# Patient Record
Sex: Female | Born: 1975 | Race: White | Hispanic: No | Marital: Single | Smoking: Former smoker
Health system: Southern US, Community
[De-identification: ages and names within clinical notes are randomized; demographics above are authoritative.]

## PROBLEM LIST (undated history)

## (undated) DIAGNOSIS — Z8614 Personal history of Methicillin resistant Staphylococcus aureus infection: Secondary | ICD-10-CM

## (undated) DIAGNOSIS — R51 Headache: Secondary | ICD-10-CM

## (undated) HISTORY — PX: DILATION AND CURETTAGE OF UTERUS: SHX78

## (undated) HISTORY — PX: HERNIA REPAIR: SHX51

---

## 2003-09-16 DIAGNOSIS — Z8614 Personal history of Methicillin resistant Staphylococcus aureus infection: Secondary | ICD-10-CM

## 2003-09-16 HISTORY — DX: Personal history of Methicillin resistant Staphylococcus aureus infection: Z86.14

## 2006-09-18 ENCOUNTER — Emergency Department (HOSPITAL_COMMUNITY): Admission: EM | Admit: 2006-09-18 | Discharge: 2006-09-18 | Payer: Self-pay | Admitting: Emergency Medicine

## 2011-01-10 LAB — HIV ANTIBODY (ROUTINE TESTING W REFLEX): HIV: NONREACTIVE

## 2011-01-10 LAB — ABO/RH: RH Type: POSITIVE

## 2011-01-10 LAB — HEPATITIS B SURFACE ANTIGEN: Hepatitis B Surface Ag: NEGATIVE

## 2011-03-03 ENCOUNTER — Other Ambulatory Visit (HOSPITAL_COMMUNITY): Payer: Self-pay | Admitting: Obstetrics and Gynecology

## 2011-03-03 DIAGNOSIS — Z3689 Encounter for other specified antenatal screening: Secondary | ICD-10-CM

## 2011-03-06 ENCOUNTER — Ambulatory Visit (HOSPITAL_COMMUNITY)
Admission: RE | Admit: 2011-03-06 | Discharge: 2011-03-06 | Disposition: A | Discharge status: Home / Self Care | Payer: Medicaid Other | Source: Ambulatory Visit | Attending: Obstetrics and Gynecology | Admitting: Obstetrics and Gynecology

## 2011-03-06 DIAGNOSIS — Z363 Encounter for antenatal screening for malformations: Secondary | ICD-10-CM | POA: Insufficient documentation

## 2011-03-06 DIAGNOSIS — Z3689 Encounter for other specified antenatal screening: Secondary | ICD-10-CM

## 2011-03-06 DIAGNOSIS — O358XX0 Maternal care for other (suspected) fetal abnormality and damage, not applicable or unspecified: Secondary | ICD-10-CM | POA: Insufficient documentation

## 2011-03-06 DIAGNOSIS — O09299 Supervision of pregnancy with other poor reproductive or obstetric history, unspecified trimester: Secondary | ICD-10-CM | POA: Insufficient documentation

## 2011-03-06 DIAGNOSIS — Z1389 Encounter for screening for other disorder: Secondary | ICD-10-CM | POA: Insufficient documentation

## 2011-03-06 DIAGNOSIS — O2620 Pregnancy care for patient with recurrent pregnancy loss, unspecified trimester: Secondary | ICD-10-CM | POA: Insufficient documentation

## 2011-03-11 ENCOUNTER — Other Ambulatory Visit (HOSPITAL_COMMUNITY): Payer: Self-pay | Admitting: Obstetrics & Gynecology

## 2011-03-11 DIAGNOSIS — Z3689 Encounter for other specified antenatal screening: Secondary | ICD-10-CM

## 2011-03-28 ENCOUNTER — Ambulatory Visit (HOSPITAL_COMMUNITY)
Admission: RE | Admit: 2011-03-28 | Discharge: 2011-03-28 | Disposition: A | Discharge status: Home / Self Care | Payer: Medicaid Other | Source: Ambulatory Visit | Attending: Obstetrics & Gynecology | Admitting: Obstetrics & Gynecology

## 2011-03-28 ENCOUNTER — Other Ambulatory Visit (HOSPITAL_COMMUNITY): Payer: Self-pay | Admitting: Obstetrics and Gynecology

## 2011-03-28 DIAGNOSIS — Z3689 Encounter for other specified antenatal screening: Secondary | ICD-10-CM | POA: Insufficient documentation

## 2011-03-28 DIAGNOSIS — O358XX0 Maternal care for other (suspected) fetal abnormality and damage, not applicable or unspecified: Secondary | ICD-10-CM

## 2011-04-09 ENCOUNTER — Ambulatory Visit (HOSPITAL_COMMUNITY)
Admission: RE | Admit: 2011-04-09 | Discharge: 2011-04-09 | Disposition: A | Discharge status: Home / Self Care | Payer: Medicaid Other | Source: Ambulatory Visit | Attending: Obstetrics and Gynecology | Admitting: Obstetrics and Gynecology

## 2011-04-09 ENCOUNTER — Encounter (HOSPITAL_COMMUNITY): Payer: Self-pay

## 2011-04-09 ENCOUNTER — Other Ambulatory Visit (HOSPITAL_COMMUNITY): Payer: Self-pay | Admitting: Obstetrics and Gynecology

## 2011-04-09 DIAGNOSIS — O358XX0 Maternal care for other (suspected) fetal abnormality and damage, not applicable or unspecified: Secondary | ICD-10-CM

## 2011-04-09 DIAGNOSIS — O2620 Pregnancy care for patient with recurrent pregnancy loss, unspecified trimester: Secondary | ICD-10-CM | POA: Insufficient documentation

## 2011-04-09 DIAGNOSIS — O09299 Supervision of pregnancy with other poor reproductive or obstetric history, unspecified trimester: Secondary | ICD-10-CM | POA: Insufficient documentation

## 2011-04-09 NOTE — Progress Notes (Signed)
Genetic Counseling  High-Risk Gestation Note  Appointment Date:  04/09/2011 Referred By: Loney Laurence, MD Date of Birth:  [birth year] Partner:  Joya Salm    Pregnancy History: [id number] Estimated Date of Delivery: 07/26/11 Estimated Gestational Age: [redacted]w[redacted]d  I met with [name] and her husband, [name], regarding the ultrasound finding of bilateral clubfeet.  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  We discussed that clubfoot/feet is a term that actually describes three distinct anomalies (talipes equinovarus, talipes calcaneovalgus, and metatarsus varus) and occurs in 1 in 1000 births.  The most common type of clubfeet, talipes equinovarus, is characterized by forefoot adduction with supination, heel varus, and ankle equines, which cannot be brought back to a neutral position.  They were counseled that clubfeet can be an isolated difference, occur as a feature of an underlying syndrome, or the result of neurological impairment.  We discussed that the most likely mode of inheritance for nonsyndromic, isolated clubfoot/feet is multifactorial.  There is a known genetic component for multifactorial clubfoot, as demonstrated by twin studies; environmental factors also play a role, including infection, drugs, and intrauterine environment (oligohydramnios, fetal positioning).  While the majority of cases of clubfoot/feet are isolated, it is a feature in more than 200 known genetic syndromes, including both chromosomal and single gene conditions.    We discussed the option of amniocentesis, including the limitations, benefits, and risks.  They understand that amniocentesis can evaluate the fetal chromosomes, but cannot detect all genetic conditions.  Specifically, we discussed that single gene conditions are difficult to diagnose prenatally unless a specific condition is suspected based on additional ultrasound findings or family history.  After thoughtful  consideration, this couple declined amniocentesis.    Given that the finding of clubfeet is associated with an increased risk for additional structural anomalies, we discussed the option of a fetal echocardiogram.  Since the ultrasound today did not reveal additional anomalies or the suggestion of a CHD, they declined the option of fetal echocardiogram.  Additionally, we discussed the option of meeting with a pediatric orthopedic specialist to discuss expectant management and treatment of clubfeet.  They would like to pursue treatment for their child at [facility].  We will schedule a prenatal consult with pediatric orthopedics for this couple.    Both family histories were reviewed and found to be noncontributory for birth defects, mental retardation, and known genetic conditions.  Without further information regarding the provided family history, an accurate genetic risk cannot be calculated.   Further genetic counseling is warranted if more information is obtained.  The patient denied exposure to environmental toxins or chemical agents.  She denied the use of alcohol, tobacco or street drugs.  She denied significant viral illnesses during the course of her pregnancy.  Her medical and surgical history were contributory for GDM, seasonal allergies, and acid reflux.   A complete obstetrical ultrasound was performed at the time of today's evaluation.  The ultrasound report is reported separately.     We counseled the patient for approximately 35 minutes regarding the above risks and available options.     Duffy Rhody, Windi Toro 04/09/2011

## 2011-04-09 NOTE — Progress Notes (Signed)
Report in AS/EPIC; follow-up in 4 weeks

## 2011-05-07 ENCOUNTER — Ambulatory Visit (HOSPITAL_COMMUNITY)
Admission: RE | Admit: 2011-05-07 | Discharge: 2011-05-07 | Disposition: A | Discharge status: Home / Self Care | Payer: Medicaid Other | Source: Ambulatory Visit | Attending: Obstetrics and Gynecology | Admitting: Obstetrics and Gynecology

## 2011-05-07 ENCOUNTER — Other Ambulatory Visit (HOSPITAL_COMMUNITY): Payer: Self-pay | Admitting: Obstetrics and Gynecology

## 2011-05-07 DIAGNOSIS — O09299 Supervision of pregnancy with other poor reproductive or obstetric history, unspecified trimester: Secondary | ICD-10-CM | POA: Insufficient documentation

## 2011-05-07 DIAGNOSIS — O358XX0 Maternal care for other (suspected) fetal abnormality and damage, not applicable or unspecified: Secondary | ICD-10-CM

## 2011-05-07 DIAGNOSIS — O2620 Pregnancy care for patient with recurrent pregnancy loss, unspecified trimester: Secondary | ICD-10-CM | POA: Insufficient documentation

## 2011-05-07 NOTE — Progress Notes (Signed)
Patient seen for ultrasound only appointment today.  Please see AS-OBGYN report for details.  

## 2011-05-14 ENCOUNTER — Encounter: Payer: Medicaid Other | Attending: Obstetrics & Gynecology

## 2011-06-05 ENCOUNTER — Inpatient Hospital Stay (HOSPITAL_COMMUNITY)
Admission: AD | Admit: 2011-06-05 | Discharge: 2011-06-05 | Disposition: A | Discharge status: Home / Self Care | Payer: Medicaid Other | Source: Ambulatory Visit | Attending: Obstetrics and Gynecology | Admitting: Obstetrics and Gynecology

## 2011-06-05 ENCOUNTER — Encounter (HOSPITAL_COMMUNITY): Payer: Self-pay | Admitting: *Deleted

## 2011-06-05 DIAGNOSIS — O368390 Maternal care for abnormalities of the fetal heart rate or rhythm, unspecified trimester, not applicable or unspecified: Secondary | ICD-10-CM | POA: Insufficient documentation

## 2011-06-05 DIAGNOSIS — Z36 Encounter for antenatal screening of mother: Secondary | ICD-10-CM

## 2011-06-05 DIAGNOSIS — Z3689 Encounter for other specified antenatal screening: Secondary | ICD-10-CM

## 2011-06-05 NOTE — ED Provider Notes (Signed)
History     No chief complaint on file.  HPI Pt is 32w5 days pregnant with Gestational Diabetes now on insulin for 3 weeks after being on Metformin with uncontrol of her blood sugars.   She was in the office today for NST and noted to have a drop in FHR and is here for monitoring.  She has not noted any contractions.  She has not had leakage of fluid or bleeding.     Past Medical History  Diagnosis Date  . No pertinent past medical history     Past Surgical History  Procedure Date  . Cesarean section   . Hernia repair   . Dilation and curettage of uterus     No family history on file.  History  Substance Use Topics  . Smoking status: Never Smoker   . Smokeless tobacco: Not on file  . Alcohol Use: No    Allergies: No Known Allergies  Prescriptions prior to admission  Medication Sig Dispense Refill  . acetaminophen (TYLENOL) 325 MG tablet Take 650 mg by mouth every 6 (six) hours as needed. Pain/headache       . insulin NPH (HUMULIN N,NOVOLIN N) 100 UNIT/ML injection Inject 15-17 Units into the skin 2 (two) times daily. 15 units in the morning & 17 units in the evening       . IRON COMBINATIONS PO Take 1 tablet by mouth daily.       . prenatal vitamin w/FE, FA (PRENATAL 1 + 1) 27-1 MG TABS Take 1 tablet by mouth daily.        . GLYBURIDE PO Take by mouth.        . Prenatal Vitamins (DIS) TABS Take by mouth.          Review of Systems  Constitutional: Negative for fever.  Gastrointestinal: Negative for nausea and abdominal pain.  Neurological: Negative for headaches.   Physical Exam   Blood pressure 113/66, pulse 88, temperature 98.5 F (36.9 C), temperature source Oral, resp. rate 20, height 5\' 3"  (1.6 m), weight 222 lb (100.699 kg).  Physical Exam  Constitutional: She is oriented to person, place, and time. She appears well-developed.  Eyes: Pupils are equal, round, and reactive to light.  Neck: Normal range of motion.  Cardiovascular: Normal rate.   GI: Soft.         FHR reactive no decelerations noted; several 10 sec variables noted  Neurological: She is alert and oriented to person, place, and time.  Skin: Skin is warm and dry.  Psychiatric: She has a normal mood and affect.    MAU Course  Procedures Pt monitored with no decelerations noted - reactive strip Dr. Claiborne Billings informed    Assessment and Plan  Insulin dependent gestational diabetic 32weeks 5 days Fetal Monitoring with no dec F/u in office  Jane Collins 06/05/2011, 2:11 PM

## 2011-06-05 NOTE — Progress Notes (Signed)
Pt sent over from office for extended monitoring due to decel.  Reports + FM.  Denies any bleeding, lof, or contractions.

## 2011-06-05 NOTE — Progress Notes (Signed)
Gest diab, had a dip in FH on NST, [place] was ok, sent in for further monitoring.

## 2011-06-18 ENCOUNTER — Ambulatory Visit (HOSPITAL_COMMUNITY): Payer: Medicaid Other

## 2011-06-19 ENCOUNTER — Ambulatory Visit (HOSPITAL_COMMUNITY)
Admission: RE | Admit: 2011-06-19 | Discharge: 2011-06-19 | Disposition: A | Discharge status: Home / Self Care | Payer: Medicaid Other | Source: Ambulatory Visit | Attending: Obstetrics and Gynecology | Admitting: Obstetrics and Gynecology

## 2011-06-19 DIAGNOSIS — O99810 Abnormal glucose complicating pregnancy: Secondary | ICD-10-CM | POA: Insufficient documentation

## 2011-06-19 DIAGNOSIS — O358XX0 Maternal care for other (suspected) fetal abnormality and damage, not applicable or unspecified: Secondary | ICD-10-CM | POA: Insufficient documentation

## 2011-06-19 DIAGNOSIS — O09299 Supervision of pregnancy with other poor reproductive or obstetric history, unspecified trimester: Secondary | ICD-10-CM | POA: Insufficient documentation

## 2011-06-19 DIAGNOSIS — O2620 Pregnancy care for patient with recurrent pregnancy loss, unspecified trimester: Secondary | ICD-10-CM | POA: Insufficient documentation

## 2011-07-02 ENCOUNTER — Other Ambulatory Visit: Payer: Self-pay | Admitting: Obstetrics and Gynecology

## 2011-07-10 ENCOUNTER — Other Ambulatory Visit: Payer: Self-pay | Admitting: Obstetrics and Gynecology

## 2011-07-16 ENCOUNTER — Encounter (HOSPITAL_COMMUNITY): Payer: Self-pay

## 2011-07-16 NOTE — Patient Instructions (Addendum)
   Your procedure is scheduled on: Wednesday November 7th  Enter through the [employer] of [facility] at: [employer] up the phone at the desk and dial [phone] and inform us of your arrival.  Please call this number if you have any problems the morning of surgery: [phone]  Remember: Do not eat food after midnight: Tuesday Do not drink clear liquids after: midnight Tuesday Take these medicines the morning of surgery with a SIP OF WATER: usual dose of 26 units of Novolin N night before surgery, no insulin morning of surgery per anesthesiologist Dr Arby Barrette  Do not wear jewelry, make-up, or FINGER nail polish Do not wear lotions, powders, or perfumes.  You may not  wear deodorant. Do not shave 48 hours prior to surgery. Do not bring valuables to the hospital.  Leave suitcase in the car. After Surgery it may be brought to your room. For patients being admitted to the hospital, checkout time is 11:00am the day of discharge.  Patients discharged on the day of surgery will not be allowed to drive home.     Remember to use your hibiclens as instructed.Please shower with 1/2 bottle the evening before your surgery and the other 1/2 bottle the morning of surgery.

## 2011-07-18 ENCOUNTER — Encounter (HOSPITAL_COMMUNITY)
Admission: RE | Admit: 2011-07-18 | Discharge: 2011-07-18 | Disposition: A | Discharge status: Home / Self Care | Payer: Medicaid Other | Source: Ambulatory Visit | Attending: Obstetrics and Gynecology | Admitting: Obstetrics and Gynecology

## 2011-07-18 ENCOUNTER — Encounter (HOSPITAL_COMMUNITY): Payer: Self-pay

## 2011-07-18 HISTORY — DX: Personal history of Methicillin resistant Staphylococcus aureus infection: Z86.14

## 2011-07-18 HISTORY — DX: Headache: R51

## 2011-07-18 LAB — CBC
HCT: 34.9 % — ABNORMAL LOW (ref 36.0–46.0)
Hemoglobin: 11.6 g/dL — ABNORMAL LOW (ref 12.0–15.0)
MCH: 29.4 pg (ref 26.0–34.0)
MCHC: 33.2 g/dL (ref 30.0–36.0)
RBC: 3.94 MIL/uL (ref 3.87–5.11)

## 2011-07-18 LAB — BASIC METABOLIC PANEL
BUN: 9 mg/dL (ref 6–23)
CO2: 24 mEq/L (ref 19–32)
Chloride: 100 mEq/L (ref 96–112)
Glucose, Bld: 95 mg/dL (ref 70–99)
Potassium: 4.1 mEq/L (ref 3.5–5.1)
Sodium: 132 mEq/L — ABNORMAL LOW (ref 135–145)

## 2011-07-18 LAB — SURGICAL PCR SCREEN: Staphylococcus aureus: NEGATIVE

## 2011-07-18 NOTE — Pre-Procedure Instructions (Signed)
Updated Dr Arby Barrette on pt insulin dosage for Gestational Diabetes-uses 26 units Novolin N in pm and 17 units Novolin N in am-take pm dose as ordered, hold am dose-pt instructed

## 2011-07-22 MED ORDER — DEXTROSE 5 % IV SOLN
2.0000 g | INTRAVENOUS | Status: DC
  Filled 2011-07-22: qty 2

## 2011-07-23 ENCOUNTER — Encounter (HOSPITAL_COMMUNITY)
Admission: RE | Disposition: A | Discharge status: Home / Self Care | Payer: Self-pay | Source: Ambulatory Visit | Attending: Obstetrics and Gynecology

## 2011-07-23 ENCOUNTER — Encounter (HOSPITAL_COMMUNITY): Payer: Self-pay | Admitting: Anesthesiology

## 2011-07-23 ENCOUNTER — Inpatient Hospital Stay (HOSPITAL_COMMUNITY)
Admission: RE | Admit: 2011-07-23 | Discharge: 2011-07-25 | DRG: 766 | Disposition: A | Discharge status: Home / Self Care | Payer: Medicaid Other | Source: Ambulatory Visit | Attending: Obstetrics and Gynecology | Admitting: Obstetrics and Gynecology

## 2011-07-23 ENCOUNTER — Encounter (HOSPITAL_COMMUNITY): Payer: Self-pay | Admitting: *Deleted

## 2011-07-23 ENCOUNTER — Inpatient Hospital Stay (HOSPITAL_COMMUNITY): Payer: Medicaid Other | Admitting: Anesthesiology

## 2011-07-23 DIAGNOSIS — O358XX0 Maternal care for other (suspected) fetal abnormality and damage, not applicable or unspecified: Secondary | ICD-10-CM

## 2011-07-23 DIAGNOSIS — O34219 Maternal care for unspecified type scar from previous cesarean delivery: Principal | ICD-10-CM | POA: Diagnosis present

## 2011-07-23 DIAGNOSIS — O99814 Abnormal glucose complicating childbirth: Secondary | ICD-10-CM | POA: Diagnosis present

## 2011-07-23 DIAGNOSIS — Z01812 Encounter for preprocedural laboratory examination: Secondary | ICD-10-CM

## 2011-07-23 DIAGNOSIS — Z01818 Encounter for other preprocedural examination: Secondary | ICD-10-CM

## 2011-07-23 SURGERY — Surgical Case
Anesthesia: Regional | Site: Uterus | Wound class: Clean Contaminated

## 2011-07-23 MED ORDER — PROMETHAZINE HCL 25 MG/ML IJ SOLN: 6.2500 mg | INTRAMUSCULAR | Status: DC | PRN

## 2011-07-23 MED ORDER — BISACODYL 10 MG RE SUPP: 10.0000 mg | Freq: Every day | RECTAL | Status: DC | PRN

## 2011-07-23 MED ORDER — BUPIVACAINE IN DEXTROSE 0.75-8.25 % IT SOLN
INTRATHECAL | Status: DC | PRN
  Administered 2011-07-23: 13 mL via INTRATHECAL

## 2011-07-23 MED ORDER — DEXTROSE 5 % IV SOLN
2.0000 g | INTRAVENOUS | Status: DC | PRN
  Administered 2011-07-23: 2 g via INTRAVENOUS

## 2011-07-23 MED ORDER — DIPHENHYDRAMINE HCL 50 MG/ML IJ SOLN
12.5000 mg | INTRAMUSCULAR | Status: DC | PRN
  Administered 2011-07-23: 12.5 mg via INTRAVENOUS

## 2011-07-23 MED ORDER — DIPHENHYDRAMINE HCL 50 MG/ML IJ SOLN: 25.0000 mg | INTRAMUSCULAR | Status: DC | PRN

## 2011-07-23 MED ORDER — PHENYLEPHRINE HCL 10 MG/ML IJ SOLN
INTRAMUSCULAR | Status: DC | PRN
  Administered 2011-07-23: 80 ug via INTRAVENOUS
  Administered 2011-07-23: 120 ug via INTRAVENOUS
  Administered 2011-07-23: 80 ug via INTRAVENOUS
  Administered 2011-07-23 (×3): 40 ug via INTRAVENOUS
  Administered 2011-07-23: 80 ug via INTRAVENOUS
  Administered 2011-07-23 (×2): 40 ug via INTRAVENOUS

## 2011-07-23 MED ORDER — FERROUS SULFATE 325 (65 FE) MG PO TABS
325.0000 mg | ORAL_TABLET | Freq: Two times a day (BID) | ORAL | Status: DC
  Administered 2011-07-24 – 2011-07-25 (×3): 325 mg via ORAL
  Filled 2011-07-23 (×3): qty 1

## 2011-07-23 MED ORDER — SENNOSIDES-DOCUSATE SODIUM 8.6-50 MG PO TABS
2.0000 | ORAL_TABLET | Freq: Every day | ORAL | Status: DC
  Administered 2011-07-23 – 2011-07-25 (×2): 2 via ORAL

## 2011-07-23 MED ORDER — OXYTOCIN 10 UNIT/ML IJ SOLN
INTRAMUSCULAR | Status: AC
  Filled 2011-07-23: qty 4

## 2011-07-23 MED ORDER — ONDANSETRON HCL 4 MG/2ML IJ SOLN: 4.0000 mg | Freq: Three times a day (TID) | INTRAMUSCULAR | Status: DC | PRN

## 2011-07-23 MED ORDER — NALBUPHINE SYRINGE 5 MG/0.5 ML
5.0000 mg | INJECTION | INTRAMUSCULAR | Status: DC | PRN
  Filled 2011-07-23: qty 1

## 2011-07-23 MED ORDER — MENTHOL 3 MG MT LOZG: 1.0000 | LOZENGE | OROMUCOSAL | Status: DC | PRN

## 2011-07-23 MED ORDER — WITCH HAZEL-GLYCERIN EX PADS: 1.0000 "application " | MEDICATED_PAD | CUTANEOUS | Status: DC | PRN

## 2011-07-23 MED ORDER — IBUPROFEN 600 MG PO TABS: 600.0000 mg | ORAL_TABLET | Freq: Four times a day (QID) | ORAL | Status: DC

## 2011-07-23 MED ORDER — DIPHENHYDRAMINE HCL 25 MG PO CAPS
25.0000 mg | ORAL_CAPSULE | ORAL | Status: DC | PRN
  Administered 2011-07-23: 25 mg via ORAL
  Filled 2011-07-23 (×3): qty 1

## 2011-07-23 MED ORDER — KETOROLAC TROMETHAMINE 30 MG/ML IJ SOLN
INTRAMUSCULAR | Status: AC
  Administered 2011-07-23: 30 mg via INTRAVENOUS
  Filled 2011-07-23: qty 1

## 2011-07-23 MED ORDER — LANOLIN HYDROUS EX OINT: 1.0000 "application " | TOPICAL_OINTMENT | CUTANEOUS | Status: DC | PRN

## 2011-07-23 MED ORDER — ZOLPIDEM TARTRATE 5 MG PO TABS: 5.0000 mg | ORAL_TABLET | Freq: Every evening | ORAL | Status: DC | PRN

## 2011-07-23 MED ORDER — PHENYLEPHRINE 40 MCG/ML (10ML) SYRINGE FOR IV PUSH (FOR BLOOD PRESSURE SUPPORT)
PREFILLED_SYRINGE | INTRAVENOUS | Status: AC
  Filled 2011-07-23: qty 10

## 2011-07-23 MED ORDER — PRENATAL PLUS 27-1 MG PO TABS
1.0000 | ORAL_TABLET | Freq: Every day | ORAL | Status: DC
  Administered 2011-07-24 – 2011-07-25 (×2): 1 via ORAL
  Filled 2011-07-23 (×2): qty 1

## 2011-07-23 MED ORDER — SCOPOLAMINE 1 MG/3DAYS TD PT72
1.0000 | MEDICATED_PATCH | Freq: Once | TRANSDERMAL | Status: DC
  Filled 2011-07-23: qty 1

## 2011-07-23 MED ORDER — TETANUS-DIPHTH-ACELL PERTUSSIS 5-2.5-18.5 LF-MCG/0.5 IM SUSP
0.5000 mL | Freq: Once | INTRAMUSCULAR | Status: AC
  Administered 2011-07-25: 0.5 mL via INTRAMUSCULAR
  Filled 2011-07-23 (×4): qty 0.5

## 2011-07-23 MED ORDER — FENTANYL CITRATE 0.05 MG/ML IJ SOLN
INTRAMUSCULAR | Status: DC | PRN
  Administered 2011-07-23: 25 ug via INTRATHECAL

## 2011-07-23 MED ORDER — PHENYLEPHRINE 40 MCG/ML (10ML) SYRINGE FOR IV PUSH (FOR BLOOD PRESSURE SUPPORT)
PREFILLED_SYRINGE | INTRAVENOUS | Status: AC
  Filled 2011-07-23: qty 15

## 2011-07-23 MED ORDER — MEASLES, MUMPS & RUBELLA VAC ~~LOC~~ INJ
0.5000 mL | INJECTION | Freq: Once | SUBCUTANEOUS | Status: DC
  Filled 2011-07-23: qty 0.5

## 2011-07-23 MED ORDER — SCOPOLAMINE 1 MG/3DAYS TD PT72
MEDICATED_PATCH | TRANSDERMAL | Status: AC
  Administered 2011-07-23: 1.5 mg via TRANSDERMAL
  Filled 2011-07-23: qty 1

## 2011-07-23 MED ORDER — NALBUPHINE SYRINGE 5 MG/0.5 ML
5.0000 mg | INJECTION | INTRAMUSCULAR | Status: DC | PRN
  Administered 2011-07-23: 10 mg via SUBCUTANEOUS
  Filled 2011-07-23: qty 1

## 2011-07-23 MED ORDER — NALOXONE HCL 0.4 MG/ML IJ SOLN: 0.4000 mg | INTRAMUSCULAR | Status: DC | PRN

## 2011-07-23 MED ORDER — MORPHINE SULFATE (PF) 0.5 MG/ML IJ SOLN
INTRAMUSCULAR | Status: DC | PRN
  Administered 2011-07-23: .15 mg via INTRATHECAL

## 2011-07-23 MED ORDER — ACETAMINOPHEN 10 MG/ML IV SOLN
1000.0000 mg | Freq: Four times a day (QID) | INTRAVENOUS | Status: AC | PRN
  Filled 2011-07-23: qty 100

## 2011-07-23 MED ORDER — METHYLERGONOVINE MALEATE 0.2 MG/ML IJ SOLN: 0.2000 mg | INTRAMUSCULAR | Status: DC | PRN

## 2011-07-23 MED ORDER — KETOROLAC TROMETHAMINE 30 MG/ML IJ SOLN: 30.0000 mg | Freq: Four times a day (QID) | INTRAMUSCULAR | Status: AC | PRN

## 2011-07-23 MED ORDER — OXYTOCIN 10 UNIT/ML IJ SOLN
INTRAMUSCULAR | Status: DC | PRN
  Administered 2011-07-23 (×2): 20 [IU] via INTRAMUSCULAR

## 2011-07-23 MED ORDER — METHYLERGONOVINE MALEATE 0.2 MG PO TABS: 0.2000 mg | ORAL_TABLET | ORAL | Status: DC | PRN

## 2011-07-23 MED ORDER — ONDANSETRON HCL 4 MG/2ML IJ SOLN
INTRAMUSCULAR | Status: AC
  Filled 2011-07-23: qty 2

## 2011-07-23 MED ORDER — FENTANYL CITRATE 0.05 MG/ML IJ SOLN
INTRAMUSCULAR | Status: AC
  Filled 2011-07-23: qty 2

## 2011-07-23 MED ORDER — DIBUCAINE 1 % RE OINT: 1.0000 "application " | TOPICAL_OINTMENT | RECTAL | Status: DC | PRN

## 2011-07-23 MED ORDER — CEFAZOLIN SODIUM 1-5 GM-% IV SOLN
1.0000 g | Freq: Three times a day (TID) | INTRAVENOUS | Status: AC
  Administered 2011-07-23 – 2011-07-24 (×2): 1 g via INTRAVENOUS
  Filled 2011-07-23 (×2): qty 50

## 2011-07-23 MED ORDER — METOCLOPRAMIDE HCL 5 MG/ML IJ SOLN: 10.0000 mg | Freq: Three times a day (TID) | INTRAMUSCULAR | Status: DC | PRN

## 2011-07-23 MED ORDER — OXYCODONE-ACETAMINOPHEN 5-325 MG PO TABS
1.0000 | ORAL_TABLET | ORAL | Status: DC | PRN
  Administered 2011-07-24 – 2011-07-25 (×3): 1 via ORAL
  Filled 2011-07-23 (×3): qty 1

## 2011-07-23 MED ORDER — ACETAMINOPHEN 325 MG PO TABS: 325.0000 mg | ORAL_TABLET | ORAL | Status: DC | PRN

## 2011-07-23 MED ORDER — KETOROLAC TROMETHAMINE 30 MG/ML IJ SOLN
15.0000 mg | Freq: Once | INTRAMUSCULAR | Status: AC | PRN
  Administered 2011-07-23: 30 mg via INTRAVENOUS

## 2011-07-23 MED ORDER — LACTATED RINGERS IV SOLN
INTRAVENOUS | Status: DC
  Administered 2011-07-23: 16:00:00 via INTRAVENOUS

## 2011-07-23 MED ORDER — IBUPROFEN 600 MG PO TABS
600.0000 mg | ORAL_TABLET | Freq: Four times a day (QID) | ORAL | Status: DC | PRN
  Administered 2011-07-23 – 2011-07-24 (×3): 600 mg via ORAL
  Filled 2011-07-23 (×7): qty 1

## 2011-07-23 MED ORDER — LACTATED RINGERS IV SOLN
INTRAVENOUS | Status: DC
  Administered 2011-07-23 (×5): via INTRAVENOUS

## 2011-07-23 MED ORDER — KETOROLAC TROMETHAMINE 30 MG/ML IJ SOLN: 30.0000 mg | Freq: Four times a day (QID) | INTRAMUSCULAR | Status: DC

## 2011-07-23 MED ORDER — IBUPROFEN 600 MG PO TABS
600.0000 mg | ORAL_TABLET | Freq: Four times a day (QID) | ORAL | Status: DC
  Administered 2011-07-24 – 2011-07-25 (×4): 600 mg via ORAL

## 2011-07-23 MED ORDER — DIPHENHYDRAMINE HCL 50 MG/ML IJ SOLN
INTRAMUSCULAR | Status: AC
  Administered 2011-07-23: 12.5 mg via INTRAVENOUS
  Filled 2011-07-23: qty 1

## 2011-07-23 MED ORDER — MEPERIDINE HCL 25 MG/ML IJ SOLN: 6.2500 mg | INTRAMUSCULAR | Status: DC | PRN

## 2011-07-23 MED ORDER — KETOROLAC TROMETHAMINE 30 MG/ML IJ SOLN: 30.0000 mg | Freq: Four times a day (QID) | INTRAMUSCULAR | Status: DC | PRN

## 2011-07-23 MED ORDER — SODIUM CHLORIDE 0.9 % IV SOLN
1.0000 ug/kg/h | INTRAVENOUS | Status: DC | PRN
  Filled 2011-07-23: qty 2.5

## 2011-07-23 MED ORDER — SIMETHICONE 80 MG PO CHEW
80.0000 mg | CHEWABLE_TABLET | ORAL | Status: DC | PRN
  Administered 2011-07-25: 80 mg via ORAL

## 2011-07-23 MED ORDER — SIMETHICONE 80 MG PO CHEW
80.0000 mg | CHEWABLE_TABLET | Freq: Three times a day (TID) | ORAL | Status: DC
  Administered 2011-07-23 – 2011-07-25 (×6): 80 mg via ORAL

## 2011-07-23 MED ORDER — ONDANSETRON HCL 4 MG/2ML IJ SOLN: 4.0000 mg | INTRAMUSCULAR | Status: DC | PRN

## 2011-07-23 MED ORDER — OXYTOCIN 20 UNITS IN LACTATED RINGERS INFUSION - SIMPLE: 125.0000 mL/h | INTRAVENOUS | Status: AC

## 2011-07-23 MED ORDER — DIPHENHYDRAMINE HCL 25 MG PO CAPS
25.0000 mg | ORAL_CAPSULE | Freq: Four times a day (QID) | ORAL | Status: DC | PRN
  Administered 2011-07-24: 25 mg via ORAL

## 2011-07-23 MED ORDER — MORPHINE SULFATE 0.5 MG/ML IJ SOLN
INTRAMUSCULAR | Status: AC
  Filled 2011-07-23: qty 10

## 2011-07-23 MED ORDER — SODIUM CHLORIDE 0.9 % IJ SOLN: 3.0000 mL | INTRAMUSCULAR | Status: DC | PRN

## 2011-07-23 MED ORDER — SCOPOLAMINE 1 MG/3DAYS TD PT72
1.0000 | MEDICATED_PATCH | Freq: Once | TRANSDERMAL | Status: DC
  Administered 2011-07-23: 1.5 mg via TRANSDERMAL

## 2011-07-23 MED ORDER — CEFAZOLIN SODIUM 1 G IJ SOLR: 1.0000 g | Freq: Three times a day (TID) | INTRAMUSCULAR | Status: DC

## 2011-07-23 MED ORDER — FLEET ENEMA 7-19 GM/118ML RE ENEM: 1.0000 | ENEMA | RECTAL | Status: DC | PRN

## 2011-07-23 MED ORDER — FENTANYL CITRATE 0.05 MG/ML IJ SOLN: 25.0000 ug | INTRAMUSCULAR | Status: DC | PRN

## 2011-07-23 MED ORDER — ONDANSETRON HCL 4 MG PO TABS: 4.0000 mg | ORAL_TABLET | ORAL | Status: DC | PRN

## 2011-07-23 SURGICAL SUPPLY — 28 items
CHLORAPREP W/TINT 26ML (MISCELLANEOUS) ×2 IMPLANT
CLOTH BEACON ORANGE TIMEOUT ST (SAFETY) ×2 IMPLANT
DRESSING TELFA 8X3 (GAUZE/BANDAGES/DRESSINGS) ×2 IMPLANT
DRSG COVADERM 4X10 (GAUZE/BANDAGES/DRESSINGS) ×2 IMPLANT
ELECT REM PT RETURN 9FT ADLT (ELECTROSURGICAL) ×2
ELECTRODE REM PT RTRN 9FT ADLT (ELECTROSURGICAL) ×1 IMPLANT
EXTRACTOR VACUUM BELL STYLE (SUCTIONS) IMPLANT
GAUZE SPONGE 4X4 12PLY STRL LF (GAUZE/BANDAGES/DRESSINGS) ×4 IMPLANT
GLOVE BIO SURGEON STRL SZ7 (GLOVE) ×4 IMPLANT
GOWN PREVENTION PLUS LG XLONG (DISPOSABLE) ×6 IMPLANT
NEEDLE HYPO 25X5/8 SAFETYGLIDE (NEEDLE) ×2 IMPLANT
NS IRRIG 1000ML POUR BTL (IV SOLUTION) ×2 IMPLANT
PACK C SECTION WH (CUSTOM PROCEDURE TRAY) ×2 IMPLANT
PAD ABD 7.5X8 STRL (GAUZE/BANDAGES/DRESSINGS) ×2 IMPLANT
RETRACTOR WND ALEXIS 25 LRG (MISCELLANEOUS) ×1 IMPLANT
RTRCTR WOUND ALEXIS 25CM LRG (MISCELLANEOUS) ×2
SLEEVE SCD COMPRESS KNEE MED (MISCELLANEOUS) IMPLANT
STAPLER VISISTAT 35W (STAPLE) ×2 IMPLANT
SUT MNCRL 0 VIOLET CTX 36 (SUTURE) ×3 IMPLANT
SUT MONOCRYL 0 CTX 36 (SUTURE) ×3
SUT PLAIN 2 0 XLH (SUTURE) ×2 IMPLANT
SUT VIC AB 0 CT1 27 (SUTURE) ×2
SUT VIC AB 0 CT1 27XBRD ANBCTR (SUTURE) ×2 IMPLANT
SUT VIC AB 2-0 CT1 27 (SUTURE) ×1
SUT VIC AB 2-0 CT1 TAPERPNT 27 (SUTURE) ×1 IMPLANT
TOWEL OR 17X24 6PK STRL BLUE (TOWEL DISPOSABLE) ×4 IMPLANT
TRAY FOLEY CATH 14FR (SET/KITS/TRAYS/PACK) ×2 IMPLANT
WATER STERILE IRR 1000ML POUR (IV SOLUTION) ×2 IMPLANT

## 2011-07-23 NOTE — H&P (Signed)
35 y.o.  [redacted]w[redacted]d   [id number] comes in for a repeat cesarean section at term.  Patient has good fetal movement and no bleeding. Blood sugars have been good on insulin.    Past Medical History  Diagnosis Date  . History of MRSA infection     history of mrsa wound infection, post op sepsis  . Diabetes mellitus     gestational diabetes-on novolin nph 17 units am and 26 units in pm, fasting generally runs 80-90  . Headache   . History of MRSA infection 2005    post op c/s infection mrsa-Idanha    Past Surgical History  Procedure Date  . Cesarean section   . Hernia repair   . Dilation and curettage of uterus     x3 after sab   History of sepsis and MRSA post op wound infection after C/S. OB History    Grav Para Term Preterm Abortions TAB SAB Ect Mult Living   5 1 1  0 3 0 3 0 0 1     # Outc Date GA Lbr Len/2nd Wgt Sex Del Anes PTL Lv   1 TRM 2007    F CS   Yes   2 SAB            3 SAB            4 SAB            5 CUR               History   Social History  . Marital Status: Married    Spouse Name: N/A    Number of Children: N/A  . Years of Education: N/A   Occupational History  . Not on file.   Social History Main Topics  . Smoking status: Former Smoker -- 15 years    Quit date: 11/15/2010  . Smokeless tobacco: Not on file  . Alcohol Use: No  . Drug Use: No  . Sexually Active: Yes    Birth Control/ Protection: None   Other Topics Concern  . Not on file   Social History Narrative  . No narrative on file   Review of patient's allergies indicates no known allergies.   Prenatal Course:  1.  A2GDM.  Early HgbA1C was 5.  On insulin as above. FBS today 78. 2.  Bilateral club feet of fetus.  Filed Vitals:   07/23/11 0606  BP: 129/77  Pulse: 100  Temp: 98.1 F (36.7 C)  Resp: 18     Lungs/Cor:  NAD Abdomen:  soft, gravid Ex:  no cords, erythema SVE:  NA FHTs:  140s  A/P   For repeat cesarean sectionat term.  All risks, benefits and alternatives discussed  with patient and she desires to proceed.  MRSA negative.    Zarin Knupp A

## 2011-07-23 NOTE — Addendum Note (Signed)
Addendum  created 07/23/11 1646 by Cephus Shelling   Modules edited:Notes Section

## 2011-07-23 NOTE — Addendum Note (Signed)
Addendum  created 07/23/11 1514 by Tyrone Apple. Malen Gauze, MD   Modules edited:Orders

## 2011-07-23 NOTE — Transfer of Care (Signed)
Immediate Anesthesia Transfer of Care Note  Patient: Jane Collins  Procedure(s) Performed:  CESAREAN SECTION  Patient Location: PACU  Anesthesia Type: Spinal  Level of Consciousness: awake, alert , oriented and patient cooperative  Airway & Oxygen Therapy: Patient Spontanous Breathing  Post-op Assessment: Report given to PACU RN and Post -op Vital signs reviewed and stable  Post vital signs: Reviewed and stable  Complications: No apparent anesthesia complications

## 2011-07-23 NOTE — Consult Note (Signed)
Neonatology Note:  Attendance at C-section:  I was asked to attend this repeat C/S at term. The mother is a G5P1A3 O pos, GBS unknown with insulin-dependent diabetes. The infant was known to have bilateral varus foot deformity by prenatal u/s. ROM at delivery, fluid clear. Infant vigorous with good spontaneous cry and tone. Needed only minimal bulb suctioning. Ap 9/9. Lungs clear to ausc in DR. Feet have positional deformation, but both are able to be moved passively to neutral position easily. To CN to care of Pediatrician.  Ilyssa Grennan, MD  

## 2011-07-23 NOTE — Anesthesia Postprocedure Evaluation (Signed)
Anesthesia Post Note  Patient: Jane Collins  Procedure(s) Performed:  CESAREAN SECTION  Anesthesia type: Spinal  Patient location: Mother/Baby  Post pain: Pain level controlled  Post assessment: Post-op Vital signs reviewed  Last Vitals:  Filed Vitals:   07/23/11 1628  BP: 99/68  Pulse: 71  Temp: 36.8 C  Resp: 16    Post vital signs: Reviewed  Level of consciousness: awake  Complications: No apparent anesthesia complications

## 2011-07-23 NOTE — Preoperative (Signed)
Beta Blockers   Reason not to administer Beta Blockers:Not Applicable 

## 2011-07-23 NOTE — Anesthesia Postprocedure Evaluation (Signed)
  Anesthesia Post-op Note  Patient: Jane Collins  Procedure(s) Performed:  CESAREAN SECTION   Patient is awake, responsive, moving her legs, and has signs of resolution of her numbness. Pain and nausea are reasonably well controlled. Vital signs are stable and clinically acceptable. Oxygen saturation is clinically acceptable. There are no apparent anesthetic complications at this time. Patient is ready for discharge.

## 2011-07-23 NOTE — Anesthesia Preprocedure Evaluation (Signed)
Anesthesia Evaluation  Patient identified by MRN, date of birth, ID band Patient awake    Reviewed: Allergy & Precautions, H&P , Patient's Chart, lab work & pertinent test results  Airway Mallampati: III TM Distance: >3 FB Neck ROM: full    Dental No notable dental hx.    Pulmonary neg pulmonary ROS,  clear to auscultation  Pulmonary exam normal       Cardiovascular neg cardio ROS regular Normal    Neuro/Psych Negative Neurological ROS  Negative Psych ROS   GI/Hepatic negative GI ROS, Neg liver ROS,   Endo/Other  Negative Endocrine ROSDiabetes mellitus-, GestationalMorbid obesity  Renal/GU negative Renal ROS     Musculoskeletal   Abdominal   Peds  Hematology negative hematology ROS (+)   Anesthesia Other Findings   Reproductive/Obstetrics (+) Pregnancy                           Anesthesia Physical Anesthesia Plan  ASA: III  Anesthesia Plan: Spinal   Post-op Pain Management:    Induction:   Airway Management Planned:   Additional Equipment:   Intra-op Plan:   Post-operative Plan:   Informed Consent: I have reviewed the patients History and Physical, chart, labs and discussed the procedure including the risks, benefits and alternatives for the proposed anesthesia with the patient or authorized representative who has indicated his/her understanding and acceptance.     Plan Discussed with:   Anesthesia Plan Comments:         Anesthesia Quick Evaluation

## 2011-07-23 NOTE — Op Note (Signed)
07/23/2011  8:16 AM  PATIENT:  Jane Collins  35 y.o. female  PRE-OPERATIVE DIAGNOSIS:  Previous Cesarean Section  POST-OPERATIVE DIAGNOSIS:  Previous Cesarean Section  PROCEDURE:  Procedure(s): CESAREAN SECTION  SURGEON:  Surgeon(s): Elray Mcgregor  ASSISTANTS: Dr. Greta Doom  ANESTHESIA:   spinal  EBL:  Total I/O In: 2800 [I.V.:2800] Out: 750 [Urine:250; Blood:500]  SPECIMEN:  No Specimen  DISPOSITION OF SPECIMEN:  N/A  COUNTS:  YES  DICTATION: See below.  PLAN OF CARE: Admit to inpatient   PATIENT DISPOSITION:  PACU - hemodynamically stable.   Delay start of Pharmacological VTE agent (>24hrs) due to surgical blood loss or risk of bleeding:  not applicable  Complications:  None Medications:  Ancef, Pitocin Findings:  Baby female, Apgars 9,9, weight 6#11.   Normal tubes, ovaries and uterus seen.  Technique:  After adequate epidural anesthesia was achieved, the patient was prepped and draped in usual sterile fashion.  A foley catheter was used to drain the bladder.  A pfannanstiel incision was made with the scalpel and carried down to the fascia with the bovie cautery. The fascia was incised in the midline with the scalpel and carried in a transverse curvilinear manner bilaterally.  The fascia was reflected superiorly and inferiorly off the rectus muscles and the muscles split in the midline.  A bowel free portion of the peritoneum was entered bluntly and then extended in a superior and inferior manner with good visualization of the bowel and bladder.  A small amount of mesh(thin less than 1 cm wide) was seen closing the peritoneum and the incision went in between.  On entry to the peritioneum, a small adhesion of the omentum to the peritioneum was carefully reduced with bovie cautery. The Alexis instrument was then placed and a 2 cm incision was made in the upper portion of the lower uterine segment until the amnion was exposed.  Clear fluid was noted and the  baby delivered in the vertex presentation without complication.  The baby was bulb suctioned and the cord was clamped and cut.  The baby was then handed to awaiting Neonatology.  The placenta was then delivered manually and the uterus cleared of all debris.  The uterine incision was then closed with a running lock stitch of 0 monocryl.  An imbricating layer of 0 monocryl was closed as well. Good  hemostasis of the uterine incision was achieved, the abdomen was cleared with irrigation and the peritoneum was closed with a running stitch of 2-0 vicryl.  This incorporated the rectus muscles as a separate layer.  After removing residual permanent suture remnants in the fascia, the fascia was then closed with a running stitch of 0 vicryl.  The subcutaneous layer was closed with interrupted  stitches of 2-0 plain gut.  Each layer was irrigated with water.The skin was closed with staples.  The patient tolerated the procedure well and was returned to the recovery room in stable condition.   All counts were correct times three.  Parlee Amescua A

## 2011-07-23 NOTE — Brief Op Note (Signed)
07/23/2011  8:16 AM  PATIENT:  Jane Collins  35 y.o. female  PRE-OPERATIVE DIAGNOSIS:  Previous Cesarean Section  POST-OPERATIVE DIAGNOSIS:  Previous Cesarean Section  PROCEDURE:  Procedure(s): CESAREAN SECTION  SURGEON:  Surgeon(s): Caitlen Worth A Omnia Dollinger W Scott Bowie  ASSISTANTS: Dr. Bowie  ANESTHESIA:   spinal  EBL:  Total I/O In: 2800 [I.V.:2800] Out: 750 [Urine:250; Blood:500]  SPECIMEN:  No Specimen  DISPOSITION OF SPECIMEN:  N/A  COUNTS:  YES  DICTATION: See below.  PLAN OF CARE: Admit to inpatient   PATIENT DISPOSITION:  PACU - hemodynamically stable.   Delay start of Pharmacological VTE agent (>24hrs) due to surgical blood loss or risk of bleeding:  not applicable  Complications:  None Medications:  Ancef, Pitocin Findings:  Baby female, Apgars 9,9, weight 6#11.   Normal tubes, ovaries and uterus seen.  Technique:  After adequate epidural anesthesia was achieved, the patient was prepped and draped in usual sterile fashion.  A foley catheter was used to drain the bladder.  A pfannanstiel incision was made with the scalpel and carried down to the fascia with the bovie cautery. The fascia was incised in the midline with the scalpel and carried in a transverse curvilinear manner bilaterally.  The fascia was reflected superiorly and inferiorly off the rectus muscles and the muscles split in the midline.  A bowel free portion of the peritoneum was entered bluntly and then extended in a superior and inferior manner with good visualization of the bowel and bladder.  A small amount of mesh(thin less than 1 cm wide) was seen closing the peritoneum and the incision went in between.  On entry to the peritioneum, a small adhesion of the omentum to the peritioneum was carefully reduced with bovie cautery. The Alexis instrument was then placed and a 2 cm incision was made in the upper portion of the lower uterine segment until the amnion was exposed.  Clear fluid was noted and the  baby delivered in the vertex presentation without complication.  The baby was bulb suctioned and the cord was clamped and cut.  The baby was then handed to awaiting Neonatology.  The placenta was then delivered manually and the uterus cleared of all debris.  The uterine incision was then closed with a running lock stitch of 0 monocryl.  An imbricating layer of 0 monocryl was closed as well. Good  hemostasis of the uterine incision was achieved, the abdomen was cleared with irrigation and the peritoneum was closed with a running stitch of 2-0 vicryl.  This incorporated the rectus muscles as a separate layer.  After removing residual permanent suture remnants in the fascia, the fascia was then closed with a running stitch of 0 vicryl.  The subcutaneous layer was closed with interrupted  stitches of 2-0 plain gut.  Each layer was irrigated with water.The skin was closed with staples.  The patient tolerated the procedure well and was returned to the recovery room in stable condition.   All counts were correct times three.  Jessey Stehlin A    

## 2011-07-23 NOTE — Anesthesia Procedure Notes (Signed)
Spinal  Patient location during procedure: OR Start time: 07/23/2011 7:20 AM Staffing Anesthesiologist: Noam Karaffa A. Performed by: anesthesiologist  Preanesthetic Checklist Completed: patient identified, site marked, surgical consent, pre-op evaluation, timeout performed, IV checked, risks and benefits discussed and monitors and equipment checked Spinal Block Patient position: sitting Prep: site prepped and draped and DuraPrep Patient monitoring: heart rate, cardiac monitor, continuous pulse ox and blood pressure Approach: midline Location: L3-4 Injection technique: single-shot Needle Needle type: Sprotte  Needle gauge: 24 G Needle length: 9 cm Needle insertion depth: 5 cm Assessment Sensory level: T4 Additional Notes Patient tolerated procedure well. Adequate sensory level for surgery.Marland Kitchen

## 2011-07-24 ENCOUNTER — Encounter (HOSPITAL_COMMUNITY): Payer: Self-pay | Admitting: Obstetrics and Gynecology

## 2011-07-24 LAB — CBC
Hemoglobin: 9.5 g/dL — ABNORMAL LOW (ref 12.0–15.0)
MCH: 29.7 pg (ref 26.0–34.0)
MCV: 89.4 fL (ref 78.0–100.0)
Platelets: 203 10*3/uL (ref 150–400)
RBC: 3.2 MIL/uL — ABNORMAL LOW (ref 3.87–5.11)

## 2011-07-24 MED ORDER — HYDROCORTISONE 1 % EX CREA
TOPICAL_CREAM | Freq: Four times a day (QID) | CUTANEOUS | Status: DC | PRN
  Administered 2011-07-24: 12:00:00 via TOPICAL
  Filled 2011-07-24: qty 28

## 2011-07-24 MED ORDER — INFLUENZA VIRUS VACC SPLIT PF IM SUSP
0.5000 mL | INTRAMUSCULAR | Status: AC
  Administered 2011-07-24: 0.5 mL via INTRAMUSCULAR
  Filled 2011-07-24: qty 0.5

## 2011-07-24 NOTE — Progress Notes (Signed)
UR chart review completed.  

## 2011-07-24 NOTE — Progress Notes (Signed)
Stable this AM.  CBC: 9.5/8.2 with 203K platelets.  Bottle feed-instructed.  Second dose of perioperative antibiotics going in now.  IV to come out afterwards and patient to shower, etc.  Was a insulin requiring GDM --  sugars checked post op yesterday were 78 and 85.  Dressing dry and abdomen soft.  Baby did not have clubbed feet as U/S suggested.  Does have rash on abdomen that itches--on po benedryl--will add some 1% hydrocortisone cream, prn.

## 2011-07-25 MED ORDER — IBUPROFEN 600 MG PO TABS: 600.0000 mg | ORAL_TABLET | Freq: Four times a day (QID) | ORAL | Status: AC | PRN

## 2011-07-25 MED ORDER — OXYCODONE-ACETAMINOPHEN 5-325 MG PO TABS: 1.0000 | ORAL_TABLET | ORAL | Status: AC | PRN

## 2011-07-25 MED ORDER — HYDROCORTISONE 1 % EX CREA: TOPICAL_CREAM | Freq: Four times a day (QID) | CUTANEOUS | Status: AC | PRN

## 2011-07-25 NOTE — Discharge Summary (Signed)
Obstetric Discharge Summary Reason for Admission: cesarean section Prenatal Procedures: none Intrapartum Procedures: cesarean: low cervical, transverse Postpartum Procedures: none Complications-Operative and Postpartum: none Hemoglobin  Date Value Range Status  07/24/2011 9.5* 12.0-15.0 (g/dL) Final     HCT  Date Value Range Status  07/24/2011 28.6* 36.0-46.0 (%) Final    Discharge Diagnoses: Term Pregnancy-delivered  Discharge Information: Date: 07/25/2011 Activity: See discharge instructions. Diet: routine Medications: PNV, Ibuprofen, Percocet and hydrocorticsone cream prn for abdominal rash Condition: stable Instructions: refer to practice specific booklet Discharge to: home Follow-up Information    Follow up with HORVATH,MICHELLE A in 4 weeks.   Contact information:   [address] [place] [address] [phone]          Newborn Data: Live born female  Birth Weight: 6 lb 11.8 oz (3056 g) APGAR: 9, 9  Home with mother.  Jane Collins 07/25/2011, 9:23 AM

## 2011-07-27 ENCOUNTER — Encounter (HOSPITAL_COMMUNITY): Payer: Self-pay | Admitting: *Deleted

## 2011-07-27 ENCOUNTER — Inpatient Hospital Stay (HOSPITAL_COMMUNITY)
Admission: AD | Admit: 2011-07-27 | Discharge: 2011-07-27 | Disposition: A | Discharge status: Home / Self Care | Payer: Medicaid Other | Source: Ambulatory Visit | Attending: Obstetrics and Gynecology | Admitting: Obstetrics and Gynecology

## 2011-07-27 DIAGNOSIS — L299 Pruritus, unspecified: Secondary | ICD-10-CM | POA: Insufficient documentation

## 2011-07-27 DIAGNOSIS — O26899 Other specified pregnancy related conditions, unspecified trimester: Secondary | ICD-10-CM | POA: Insufficient documentation

## 2011-07-27 DIAGNOSIS — T8131XA Disruption of external operation (surgical) wound, not elsewhere classified, initial encounter: Secondary | ICD-10-CM

## 2011-07-27 DIAGNOSIS — R21 Rash and other nonspecific skin eruption: Secondary | ICD-10-CM

## 2011-07-27 DIAGNOSIS — O909 Complication of the puerperium, unspecified: Secondary | ICD-10-CM | POA: Insufficient documentation

## 2011-07-27 NOTE — Progress Notes (Signed)
Patient reports having bleeding at staples from C/S on Wednesday.

## 2011-07-27 NOTE — ED Provider Notes (Signed)
History     Chief Complaint  Patient presents with  . Wound Check   HPIJamie Elkordy34 y.o. patient of Dr. Kittie Plater presents with ?bleeding from her C-Section incision.  Surgery done 11/7.  States she also has a rash, known to her doctor, treated with Hydrocortisone--used "whole tube" and it is not helpful.  Is not breastfeeding.  Also has edema in her ankles.  Has been drinking a lot of soda's instead of water.     Past Medical History  Diagnosis Date  . History of MRSA infection     history of mrsa wound infection, post op sepsis  . Diabetes mellitus     gestational diabetes-on novolin nph 17 units am and 26 units in pm, fasting generally runs 80-90  . Headache   . History of MRSA infection 2005    post op c/s infection mrsa-[place]    Past Surgical History  Procedure Date  . Cesarean section   . Hernia repair   . Dilation and curettage of uterus     x3 after sab  . Cesarean section 07/23/2011    Procedure: CESAREAN SECTION;  Surgeon: Loney Laurence;  Location: [facility];  Service: Gynecology;  Laterality: N/A;    No family history on file.  History  Substance Use Topics  . Smoking status: Former Smoker -- 15 years    Quit date: 11/15/2010  . Smokeless tobacco: Not on file  . Alcohol Use: No    Allergies: No Known Allergies  Prescriptions prior to admission  Medication Sig Dispense Refill  . acetaminophen (TYLENOL) 325 MG tablet Take 650 mg by mouth every 6 (six) hours as needed. Pain/headache       . hydrocortisone 1 % cream Apply topically 4 (four) times daily as needed (for prn use on rash on abdomen and elsewhere.).  30 g  0  . ibuprofen (ADVIL,MOTRIN) 600 MG tablet Take 1 tablet (600 mg total) by mouth every 6 (six) hours as needed for pain.  30 tablet  1  . oxyCODONE-acetaminophen (PERCOCET) 5-325 MG per tablet Take 1-2 tablets by mouth every 4 (four) hours as needed (moderate - severe pain).  30 tablet  0  . Prenatal Vitamins (DIS) TABS Take by mouth.           Review of Systems  HENT: Negative.   Gastrointestinal: Negative.   Genitourinary:       Incisional bleeding  Musculoskeletal:       Bilateral pedal edema  Skin: Positive for itching and rash.       Physical Exam  Constitutional: She is oriented to person, place, and time. She appears well-developed and well-nourished. No distress.  HENT:  Head: Normocephalic.  Neck: Normal range of motion.  Cardiovascular: Normal rate.   Respiratory: Effort normal.  GI: Soft.       Incision is healing well, staples intact.  With gentle palpation, blood or fluid is not seen.  Without redness, signs of infection.    Musculoskeletal: She exhibits edema (bilateral pedal edema).  Neurological: She is alert and oriented to person, place, and time.  Skin: Skin is warm and dry.   BP 109/78  Pulse 87  Temp 98.2 F (36.8 C)  Resp 16  Breastfeeding? Unknown  MAU Course  Procedures  MDM: reported to Dr. Claiborne Billings [facility], physical exam.  Order given for patient to try Benadryl and triple antibiotic ointment.  Keep appt in the office for incision check tomorrow.   Assessment and Plan  A;  Incision healing well      Rash- pruritis      P:  Instructed to get triple antibiotic cream for rash.  May also use Benadryl OTC as directed on the bottle for itching.  Keep appt for tomorrow for incision check.  Encouraged to decrease number of colas and add water to better hydrate.   KEY,EVE M 07/27/2011, 12:43 PM   Matt Holmes, NP 07/27/11 1313

## 2011-07-31 ENCOUNTER — Ambulatory Visit (HOSPITAL_COMMUNITY): Payer: Medicaid Other

## 2011-12-14 IMAGING — US US OB LIMITED
1 series · 14 of 28 positions shown · non-contrast
Comparison: none

[Series 1: us ob limited · 0.17mm/px · 14 of 46 slices shown]
[im 2/46]
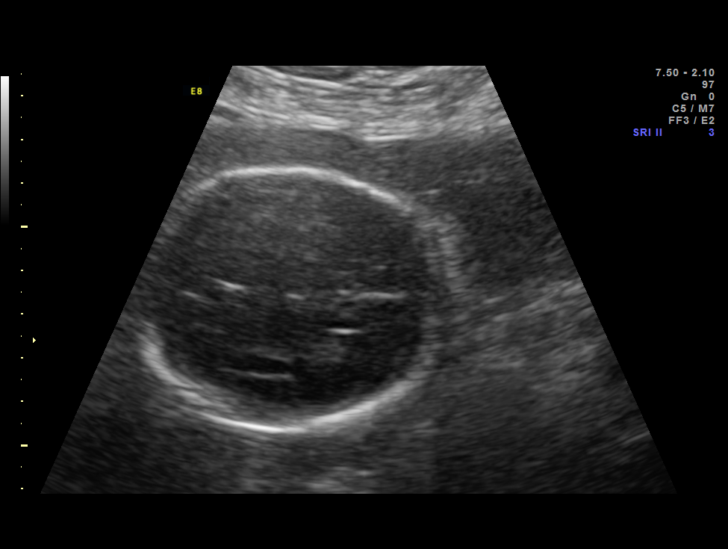
[im 6/46]
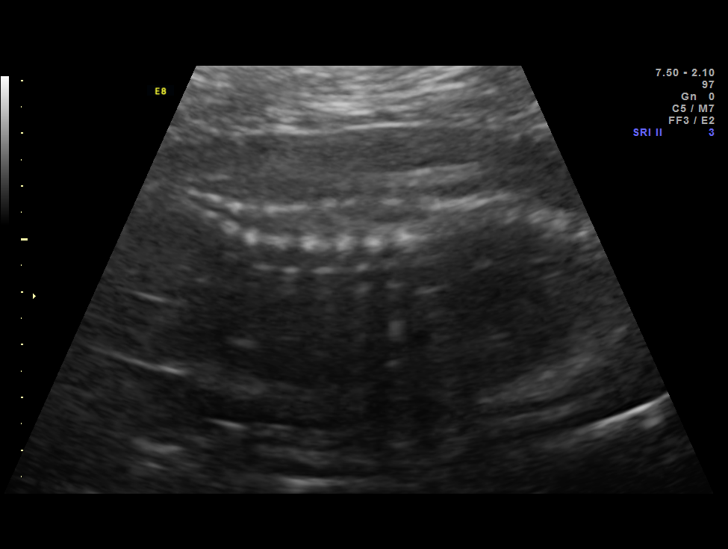
[im 9/46]
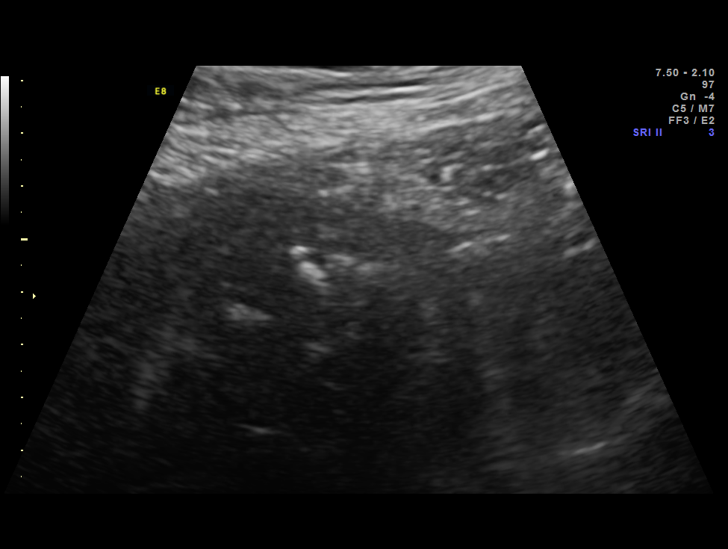
[im 12/46]
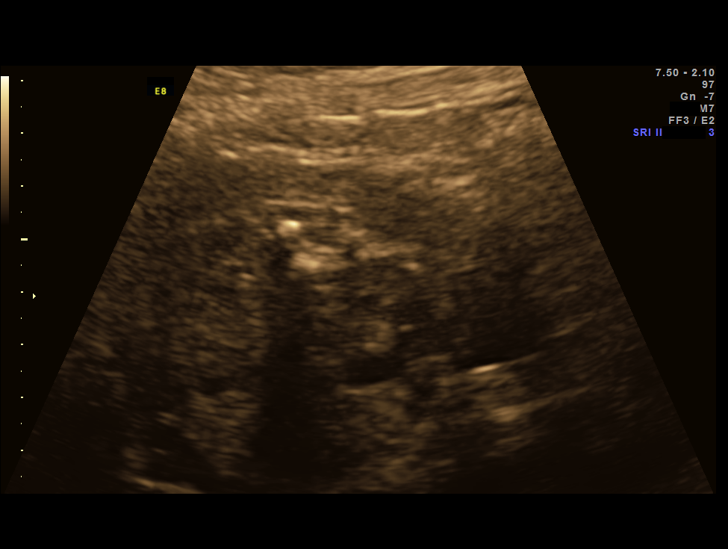
[im 16/46]
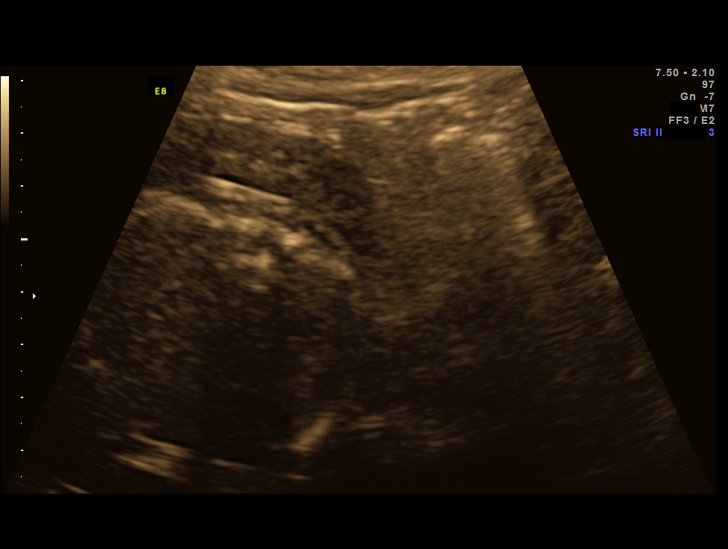
[im 19/46]
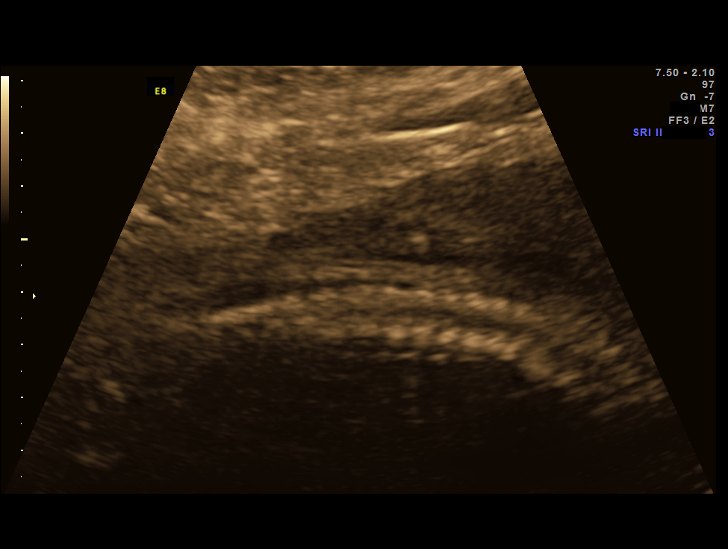
[im 22/46]
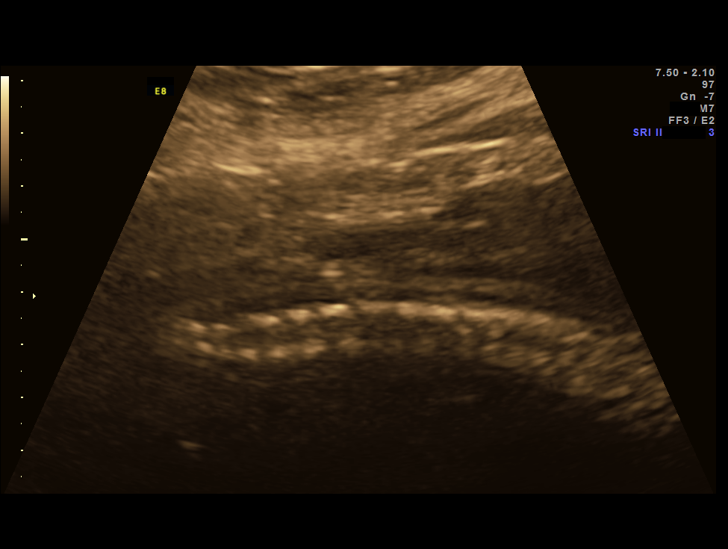
[im 26/46]
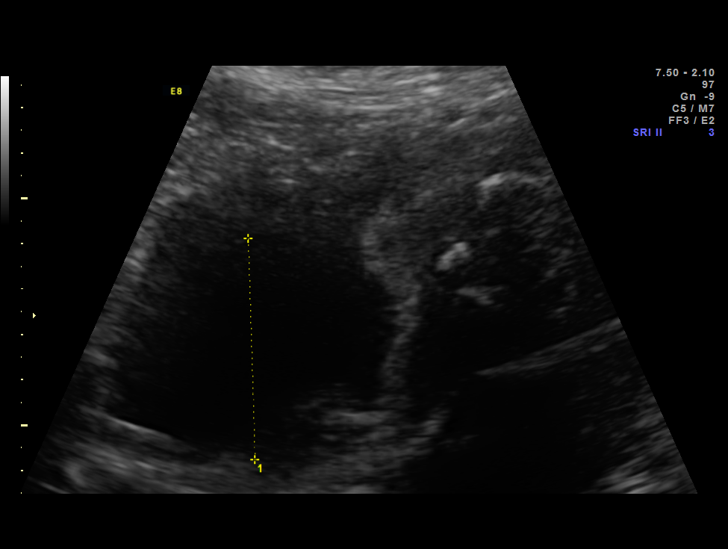
[im 29/46]
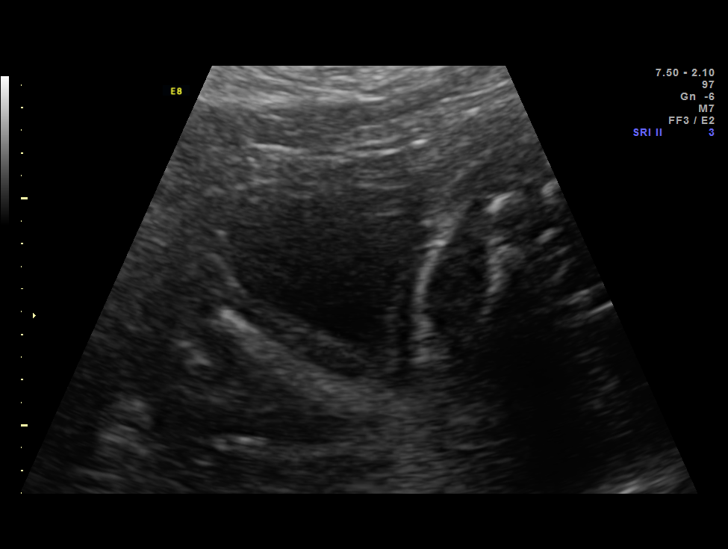
[im 32/46]
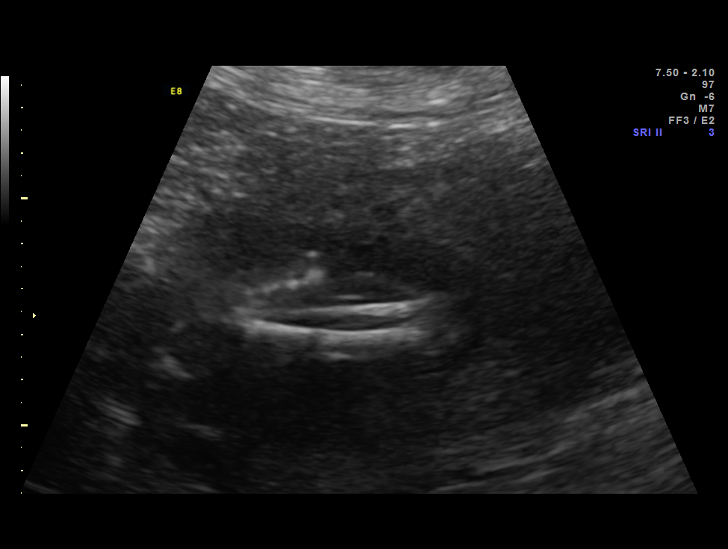
[im 36/46]
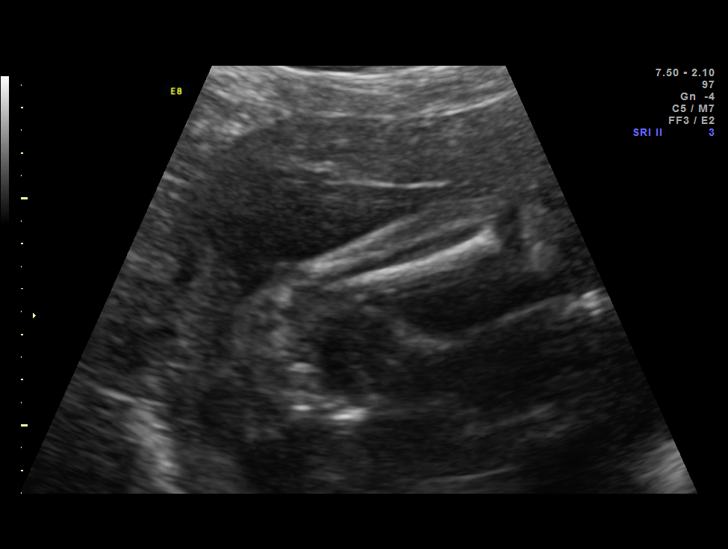
[im 39/46]
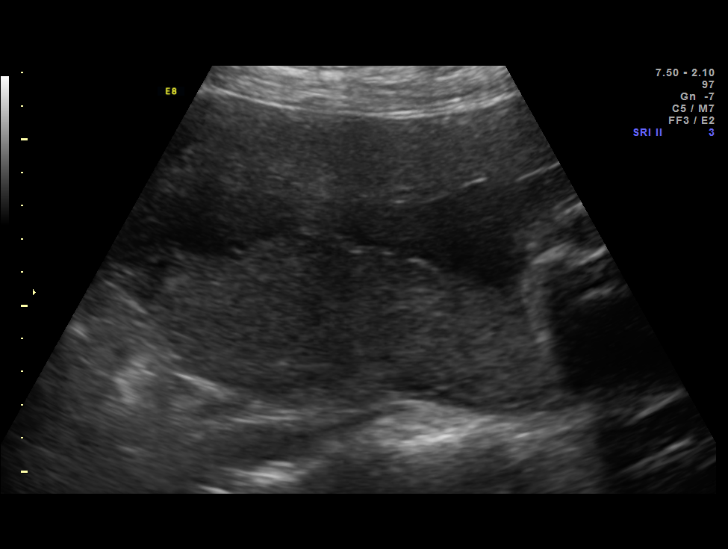
[im 42/46]
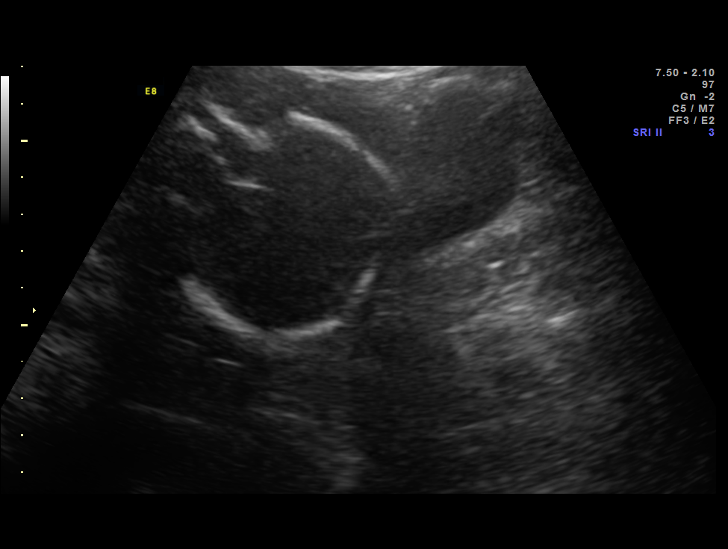
[im 46/46]
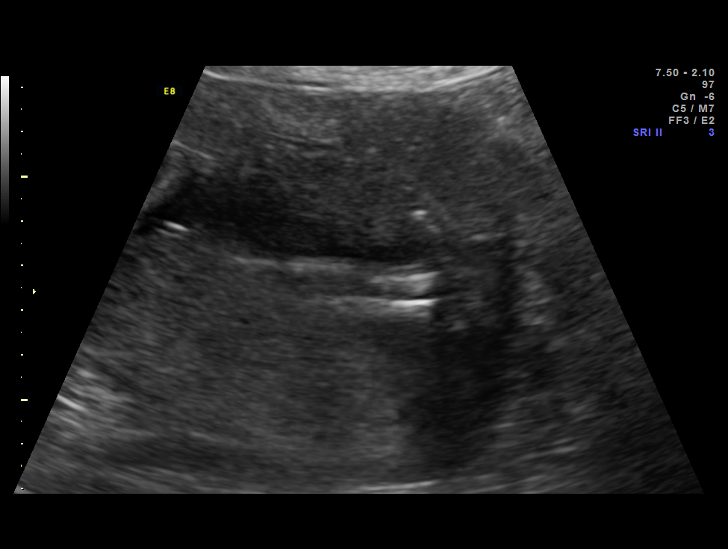

[14 of 28 positions shown; findings below may reference images not displayed]

Canned report from images found in remote index.

Refer to host system for actual result text.

## 2013-06-18 IMAGING — US US OB DETAIL+14 WK
1 series · 12 of 28 positions shown · non-contrast
Comparison: none

[Series 1: us ob detail +14 wk · 35 acquisitions, 12 frames shown]
[im 2/35]
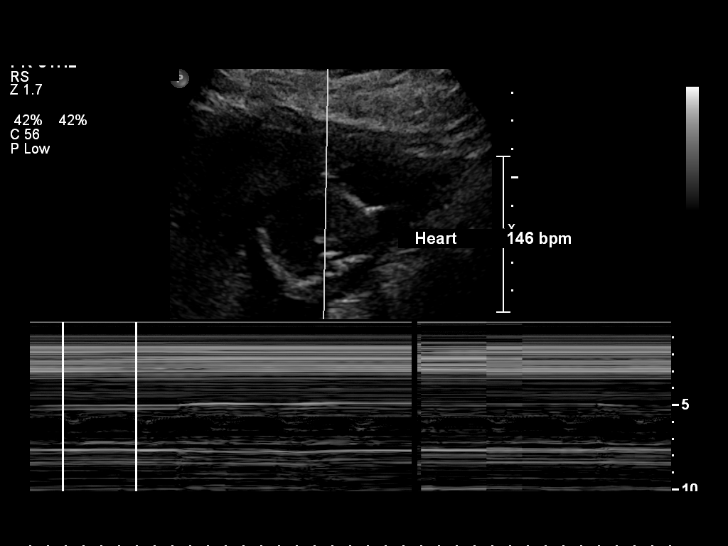
[im 4/35]
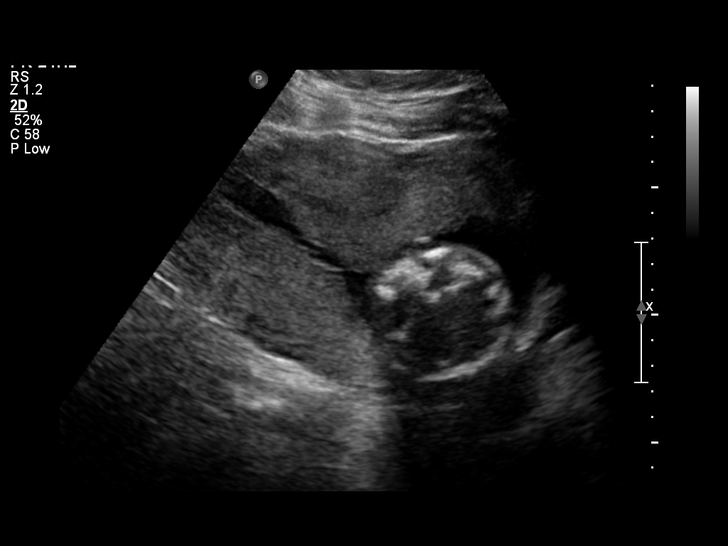
[im 7/35]
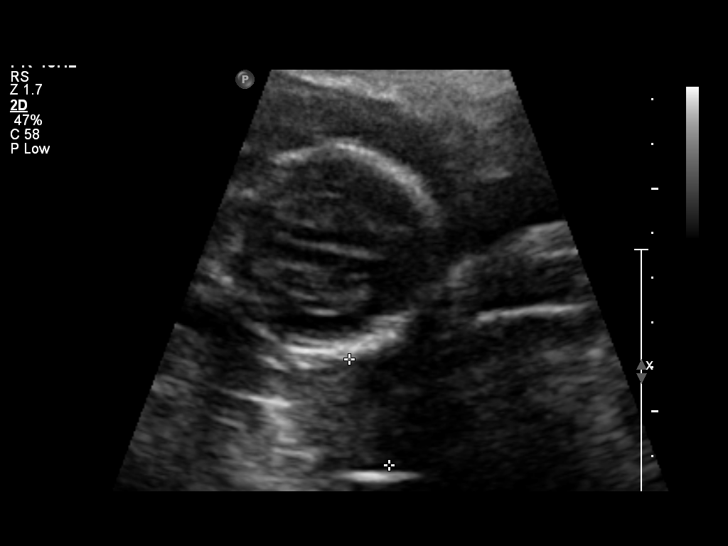
[im 11/35]
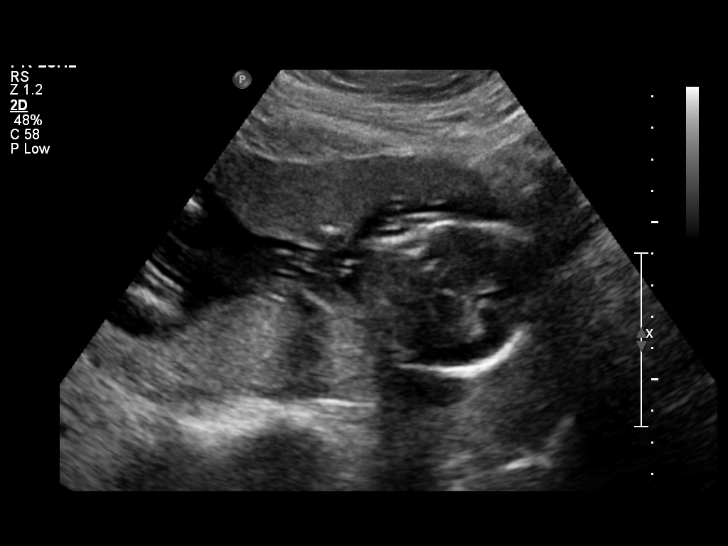
[im 13/35]
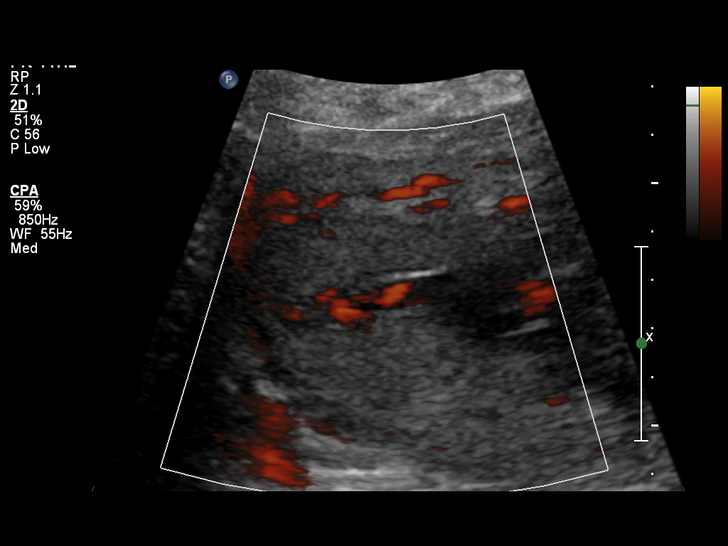
[im 16/35]
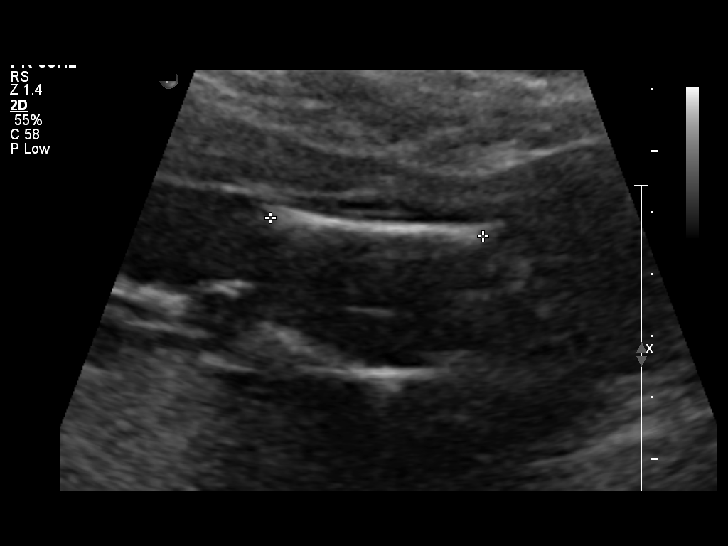
[im 19/35]
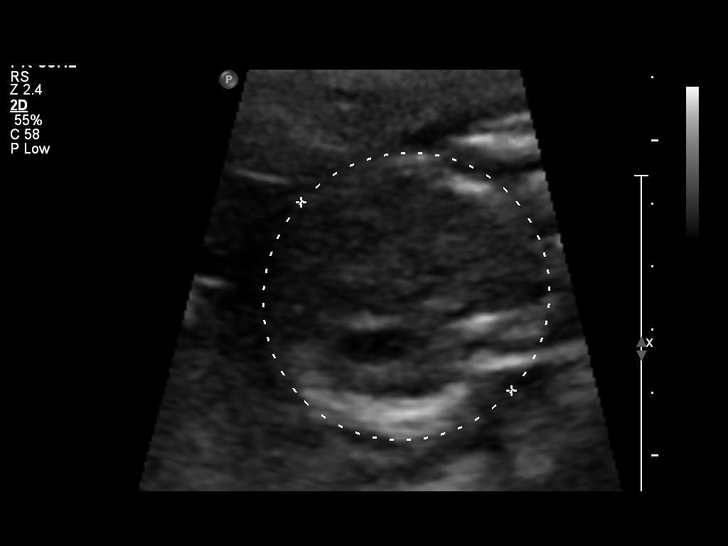
[im 22/35]
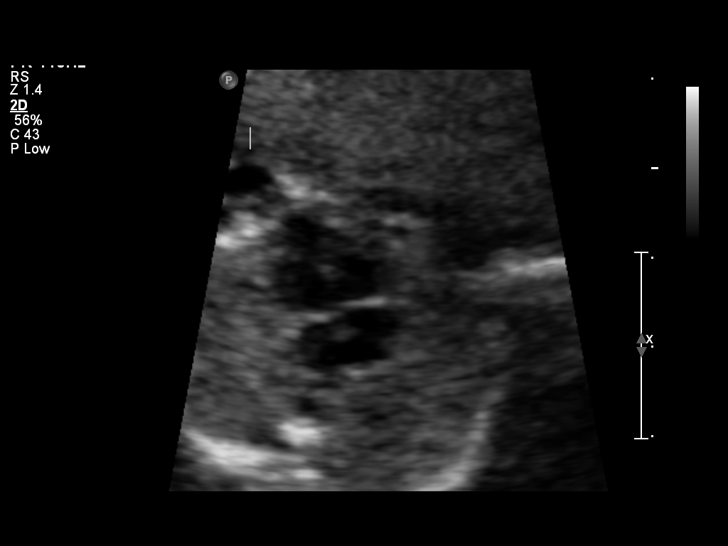
[im 24/35]
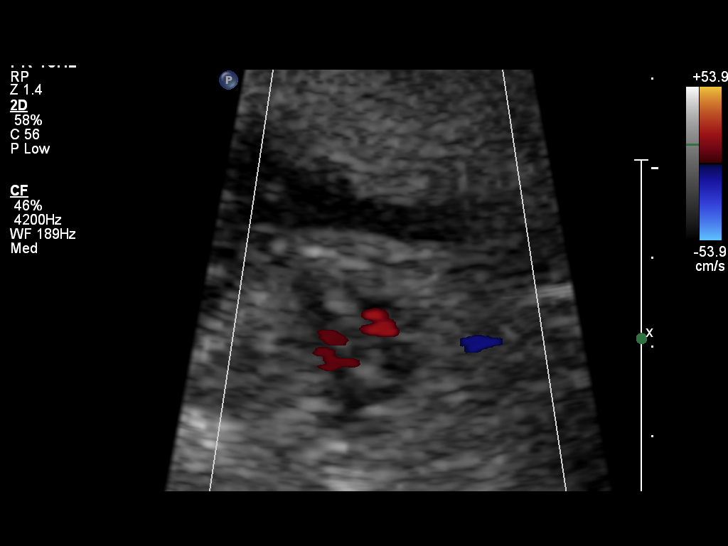
[im 28/35]
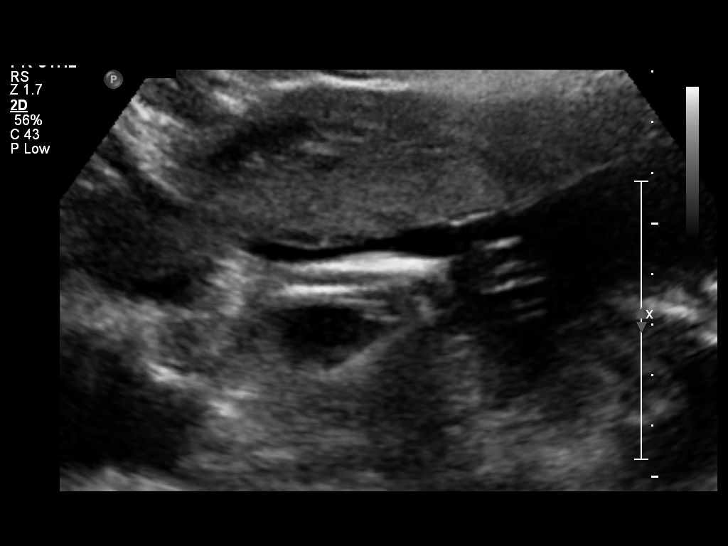
[im 31/35]
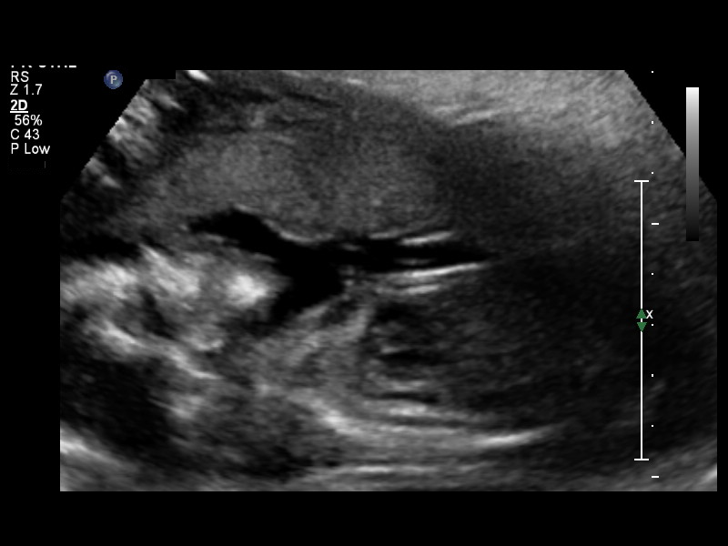
[im 33/35]
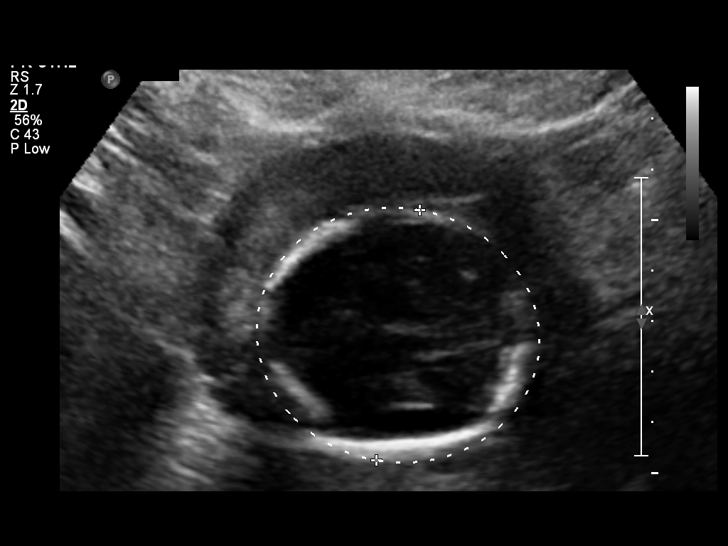

[12 of 28 positions shown; findings below may reference images not displayed]

OBSTETRICS REPORT

 Order#:         [id number]
Procedures

 US OB DETAIL + 14 WK                                  76811.0
Indications

 Poor obstetric history: Previous gestational
 diabetes
 Poor obstetric history-Recurrent (habitual) abortion
 (3 consecutive ab's)
 Detailed fetal anatomic survey
Fetal Evaluation

 Fetal Heart Rate:  146                          bpm
 Cardiac Activity:  Observed
 Presentation:      Cephalic
 Placenta:          Right lateral, above
                    cervical os
 P. Cord            Visualized
 Insertion:

 Amniotic Fluid
 AFI FV:      Subjectively within normal limits
                                             Larg Pckt:     4.8  cm
Biometry

 BPD:     46.8  mm     G. Age:  20w 1d                CI:        79.33   70 - 86
                                                      FL/HC:      20.8   16.8 -

 HC:     166.1  mm     G. Age:  19w 2d       24  %    HC/AC:      1.16   1.09 -

 AC:     142.6  mm     G. Age:  19w 4d       42  %    FL/BPD:
 FL:      34.6  mm     G. Age:  20w 6d       81  %    FL/AC:      24.3   20 - 24
 HUM:     32.2  mm     G. Age:  20w 5d       81  %

 Est. FW:     333  gm    0 lb 12 oz      54  %
Gestational Age

 LMP:           19w 5d        Date:  10/19/10                 EDD:   07/26/11
 U/S Today:     20w 0d                                        EDD:   07/24/11
 Best:          19w 5d     Det. By:  LMP  (10/19/10)          EDD:   07/26/11
Anatomy

 Cranium:           Not well            Aortic Arch:       Appears normal
                    visualized
 Fetal Cavum:       Not well            Ductal Arch:       Appears normal
                    visualized
 Ventricles:        Not well            Diaphragm:         Appears normal
                    visualized
 Choroid Plexus:    Not well            Stomach:           Appears
                    visualized                             normal, left
                                                           sided
 Cerebellum:        Not well            Abdomen:           Not well
                    visualized                             visualized
 Posterior Fossa:   Not well            Abdominal Wall:    Not well
                    visualized                             visualized
 Nuchal Fold:       Not well            Cord Vessels:      Not well
                    visualized                             visualized
 Face:              Profile appears     Kidneys:           Not well
                    normal                                 visualized
 Heart:             Not well            Bladder:           Not well
                    visualized                             visualized
 RVOT:              Not well            Spine:             Not well
                    visualized                             visualized
 LVOT:              Appears normal      Limbs:             Four extremities
                                                           seen

 Other:     Technically difficult due to maternal habitus and fetal
            position. Heels visualized.
Cervix Uterus Adnexa

 Cervical Length:    2.7      cm

 Cervix:       Normal appearance by transabdominal scan.

 Left Ovary:    Not visualized.
 Right Ovary:   Not visualized.
Comments

 Patient has history of abdominal hernia repair and a
 sonographic window is difficult to acquire, limiting
 assessment.
Impression

 Siup demonstrating an EGA by ultrasound of 20w 0d. This
 corresponds well with expected EGA by LMP of 19w 5d.

 Anatomic assessment is severely compromised by poor
 imaging characteristics resulting from lack of adequate
 sonographic scanning window due to prior abdominal hernia
 repair. Limited visualized anatomy appears normal but fetal
 anatomic assessment is very limited.

 Subjectively and quantitatively normal amniotic fluid volume.
 Normal cervical appearance.

 questions or concerns.

## 2013-10-01 IMAGING — US US OB FOLLOW-UP
1 series · 14 of 28 positions shown · non-contrast
Comparison: none

[Series 1: us ob follow-up · 45 acquisitions, 14 frames shown]
[im 2/45]
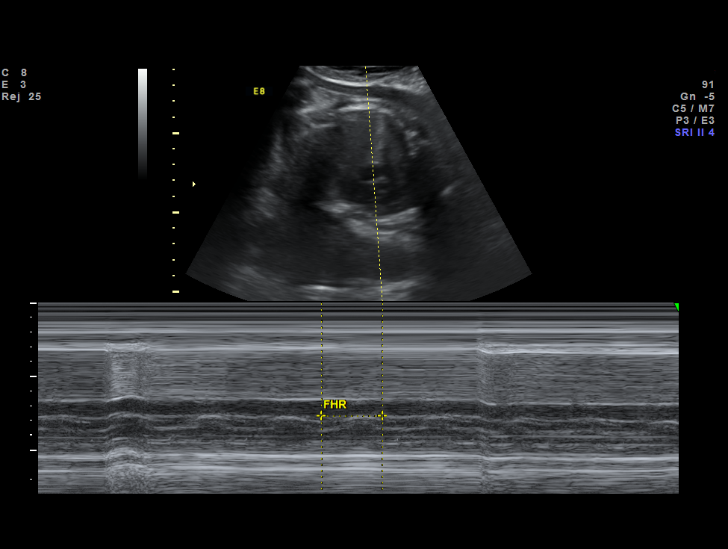
[im 5/45]
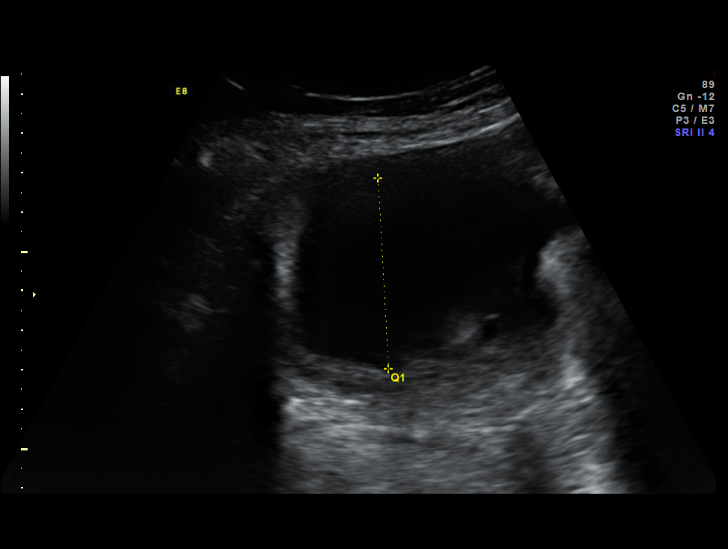
[im 9/45]
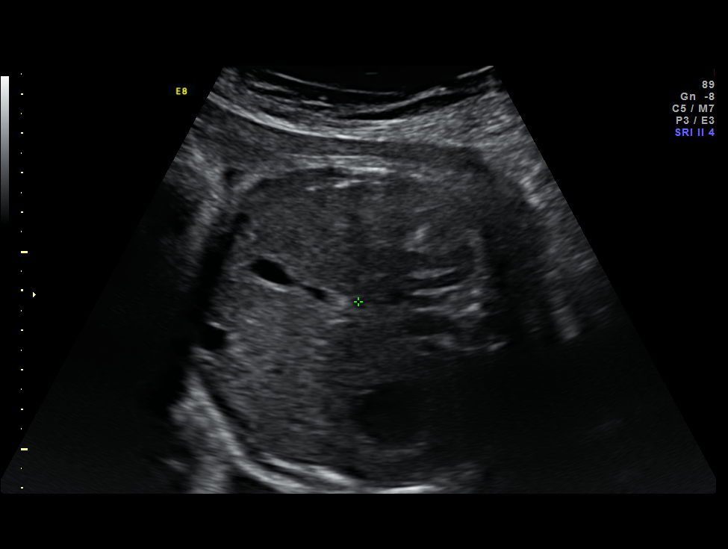
[im 12/45]
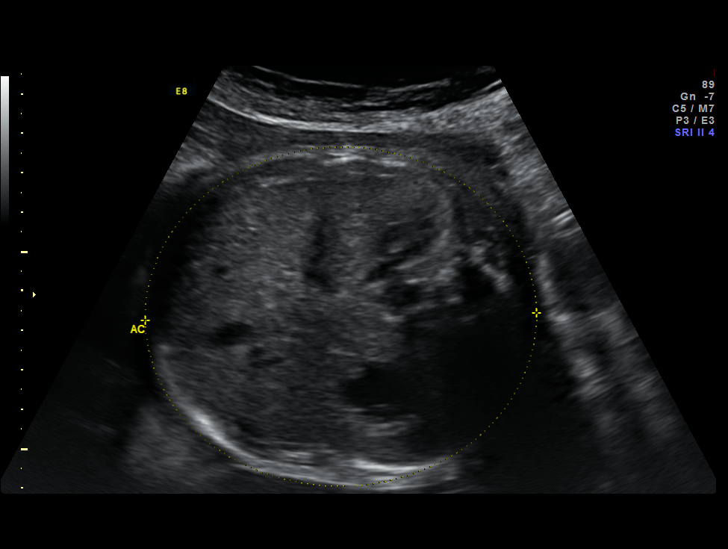
[im 15/45]
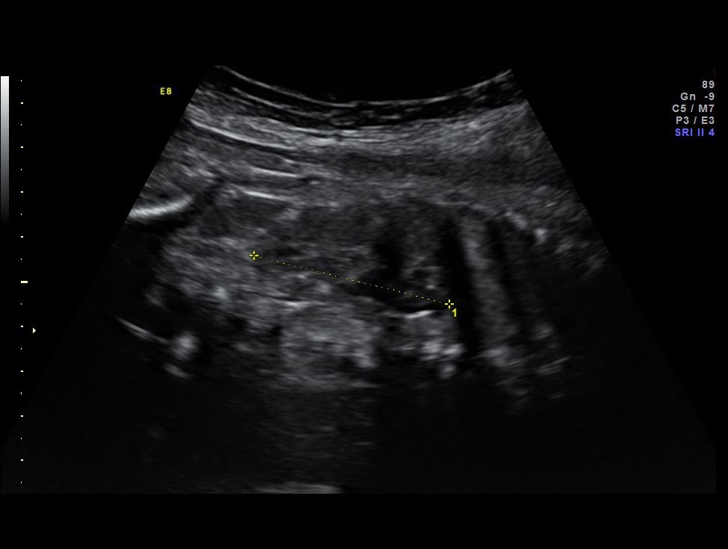
[im 18/45]
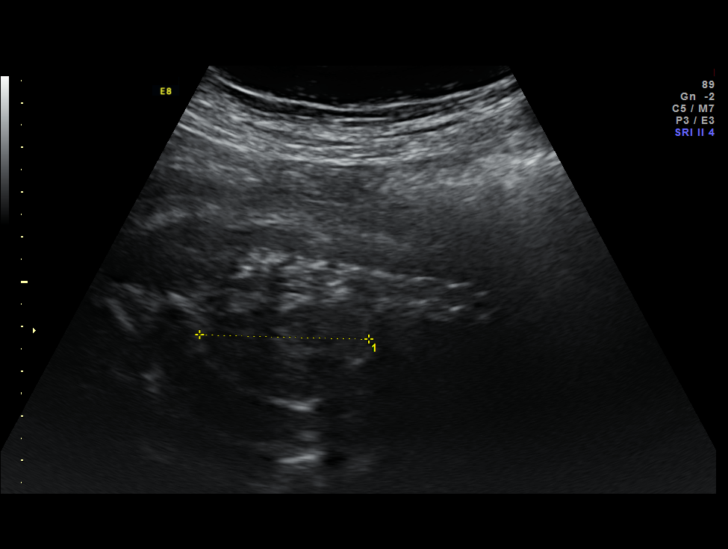
[im 22/45]
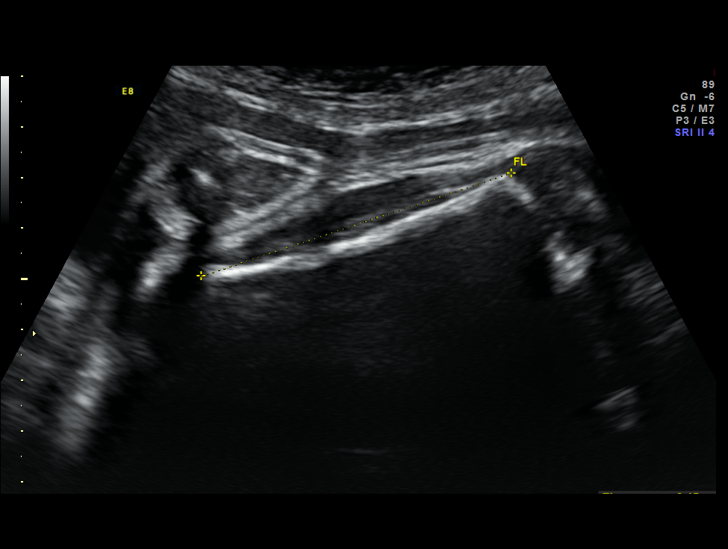
[im 25/45]
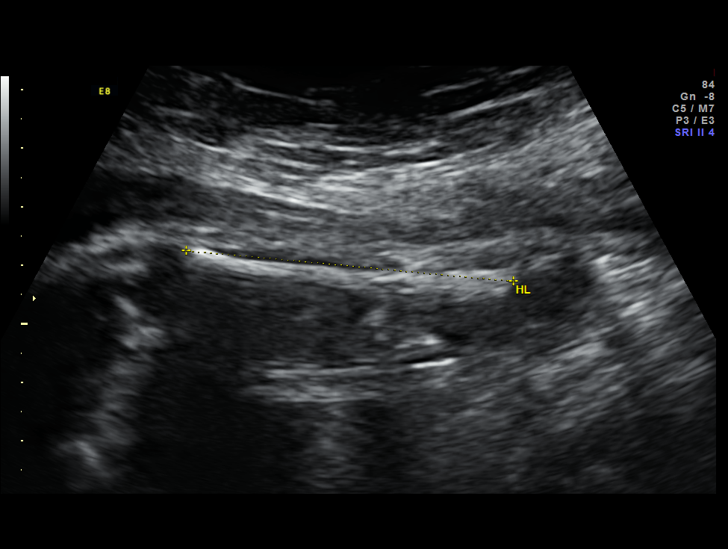
[im 28/45]
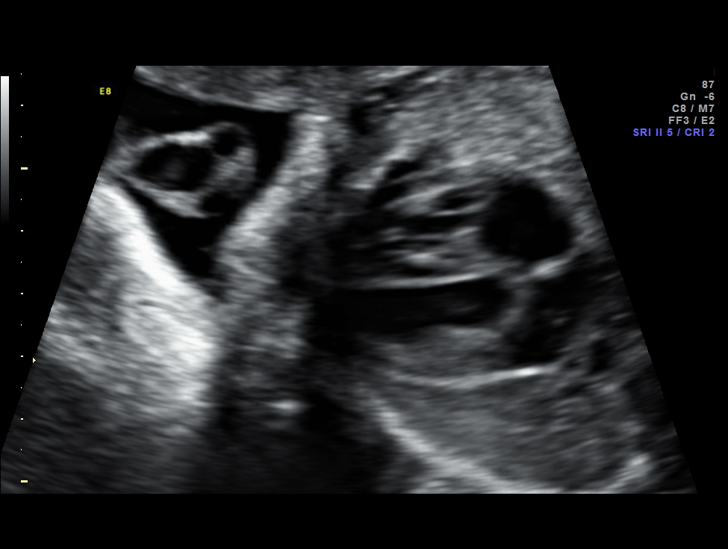
[im 31/45]
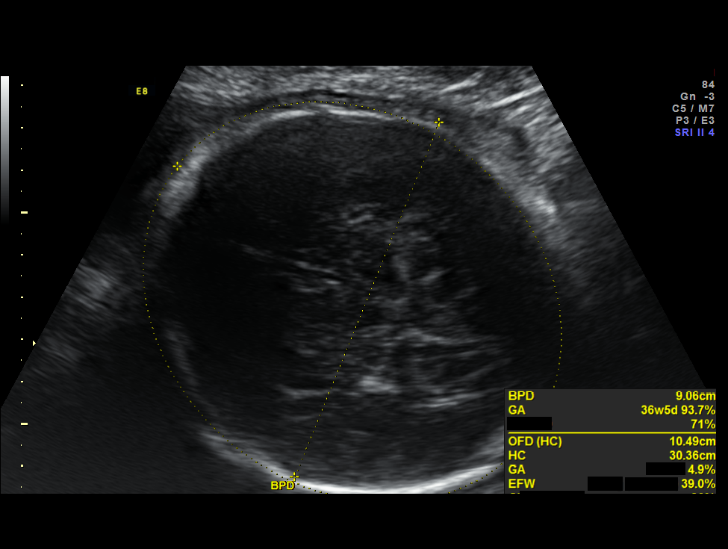
[im 35/45]
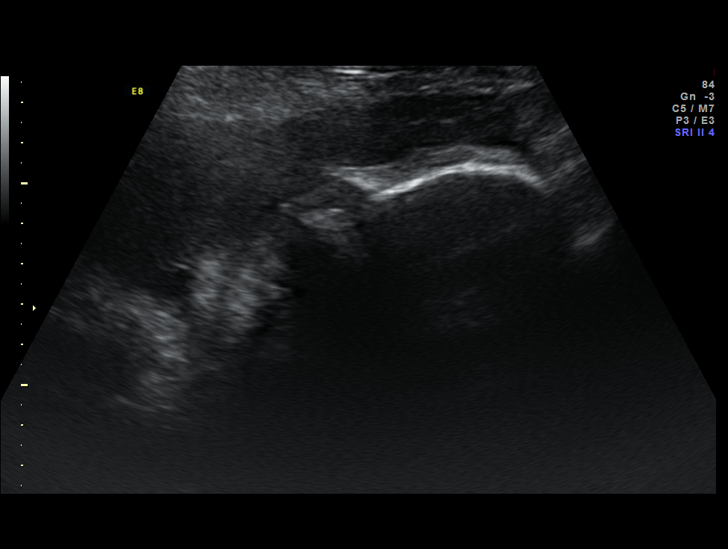
[im 38/45]
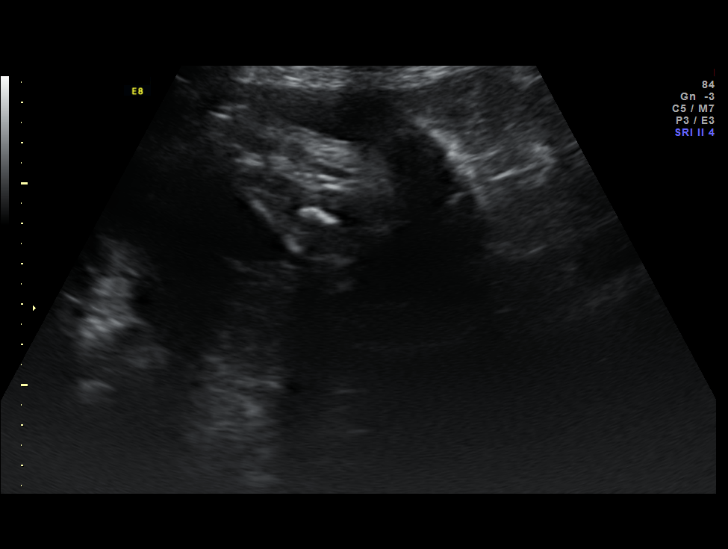
[im 41/45]
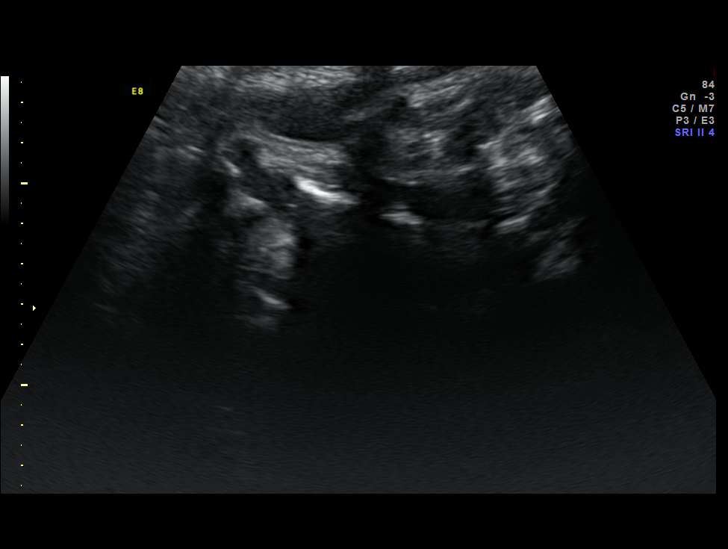
[im 45/45]
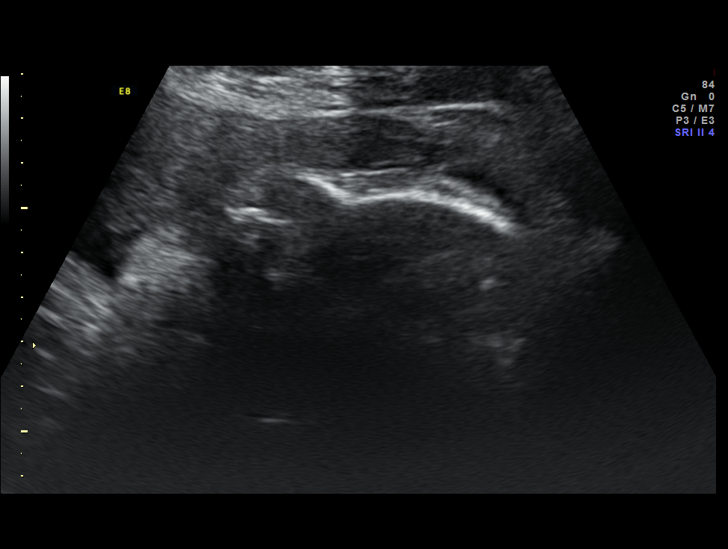

[14 of 28 positions shown; findings below may reference images not displayed]

Canned report from images found in remote index.

Refer to host system for actual result text.

## 2014-07-17 ENCOUNTER — Encounter (HOSPITAL_COMMUNITY): Payer: Self-pay | Admitting: *Deleted

## 2016-02-14 ENCOUNTER — Ambulatory Visit: Payer: Self-pay | Admitting: Sports Medicine

## 2023-01-18 ENCOUNTER — Other Ambulatory Visit: Payer: Self-pay

## 2023-01-18 ENCOUNTER — Emergency Department (HOSPITAL_BASED_OUTPATIENT_CLINIC_OR_DEPARTMENT_OTHER)
Admission: EM | Admit: 2023-01-18 | Discharge: 2023-01-18 | Disposition: A | Discharge status: Home / Self Care | Payer: Medicaid Other | Attending: Emergency Medicine | Admitting: Emergency Medicine

## 2023-01-18 ENCOUNTER — Encounter (HOSPITAL_BASED_OUTPATIENT_CLINIC_OR_DEPARTMENT_OTHER): Payer: Self-pay | Admitting: Emergency Medicine

## 2023-01-18 DIAGNOSIS — G8918 Other acute postprocedural pain: Secondary | ICD-10-CM

## 2023-01-18 DIAGNOSIS — E119 Type 2 diabetes mellitus without complications: Secondary | ICD-10-CM | POA: Diagnosis not present

## 2023-01-18 MED ORDER — KETOROLAC TROMETHAMINE 15 MG/ML IJ SOLN
15.0000 mg | Freq: Once | INTRAMUSCULAR | Status: AC
  Administered 2023-01-18: 15 mg via INTRAMUSCULAR
  Filled 2023-01-18: qty 1

## 2023-01-18 MED ORDER — OXYCODONE HCL 5 MG PO TABS: 5.0000 mg | ORAL_TABLET | Freq: Four times a day (QID) | ORAL | 0 refills | Status: DC | PRN

## 2023-01-18 NOTE — Discharge Instructions (Addendum)
We evaluated you for your dental pain.  We did not see signs of infection.  Please continue to take Tylenol and ibuprofen for your symptoms at home.  You can take 1000 mg of Tylenol every 6 hours and 600 mg of ibuprofen every 6 hours as needed for your symptoms.  You can take these medicines together as needed, either at the same time, or alternating every 3 hours.  Since your pain is not controlled on this regimen, have given you a very small amount of oxycodone.  You can take this every 6 hours as needed for pain.  Do not drive or drink alcohol while taking this medication.  Please call your oral surgeon tomorrow so that you can discuss your uncontrolled pain.  Please return if you develop any difficulty breathing or difficulty swallowing, high fevers, worsening swelling, redness to your face, or any other concerning symptoms.

## 2023-01-18 NOTE — ED Provider Notes (Signed)
[place] EMERGENCY DEPARTMENT AT [facility] Provider Note  CSN: [id number] Arrival date & time: 01/18/23 1910  Chief Complaint(s) Dental Pain  HPI Jane Collins is a 47 y.o. female with history of diabetes presenting to the emergency department with dental pain.  Patient reports that on Wednesday she had 5 teeth extracted.  Her oral surgeon gave her prescription for ibuprofen.  She has been taking ibuprofen and Tylenol around-the-clock without relief of her symptoms.  No fevers or chills.  She is also been taking her antibiotics as prescribed.  She has been also applying ice to her face without significant improvement.   Past Medical History Past Medical History:  Diagnosis Date   Diabetes mellitus    gestational diabetes-on novolin nph 17 units am and 26 units in pm, fasting generally runs 80-90   Headache(784.0)    History of MRSA infection    history of mrsa wound infection, post op sepsis   History of MRSA infection 2005   post op c/s infection mrsa-[place]   Patient Active Problem List   Diagnosis Date Noted   Other known or suspected fetal abnormality, not elsewhere classified, affecting management of mother, antepartum condition or complication 04/09/2011   Home Medication(s) Prior to Admission medications   Medication Sig Start Date End Date Taking? Authorizing Provider  oxyCODONE (ROXICODONE) 5 MG immediate release tablet Take 1 tablet (5 mg total) by mouth every 6 (six) hours as needed for breakthrough pain. 01/18/23  Yes Lonell Grandchild, MD  acetaminophen (TYLENOL) 325 MG tablet Take 650 mg by mouth every 6 (six) hours as needed. Pain/headache     [provider]  Prenatal Vitamins (DIS) TABS Take by mouth.      [provider]                                                                                                                                    Past Surgical History Past Surgical History:  Procedure Laterality Date   CESAREAN  SECTION     CESAREAN SECTION  07/23/2011   Procedure: CESAREAN SECTION;  Surgeon: Loney Laurence;  Location: [facility];  Service: Gynecology;  Laterality: N/A;   DILATION AND CURETTAGE OF UTERUS     x3 after sab   HERNIA REPAIR     Family History History reviewed. No pertinent family history.  Social History Social History   Tobacco Use   Smoking status: Former    Years: 15    Types: Cigarettes    Quit date: 11/15/2010    Years since quitting: 12.1  Substance Use Topics   Alcohol use: No   Drug use: No   Allergies Patient has no known allergies.  Review of Systems Review of Systems  All other systems reviewed and are negative.   Physical Exam Vital Signs  I have reviewed the triage vital signs BP 139/84   Pulse 72   Temp 98.4  F (36.9 C)   Resp 18   Ht 5\' 4"  (1.626 m)   Wt 85.3 kg   LMP 01/07/2023   SpO2 100%   BMI 32.27 kg/m  Physical Exam Vitals and nursing note reviewed.  Constitutional:      Appearance: Normal appearance.  HENT:     Head: Normocephalic and atraumatic.     Mouth/Throat:     Mouth: Mucous membranes are moist.     Comments: Nearly edentulous.  5 scattered fresh tooth extraction sites around the upper and lower bilateral jaw.  No external facial swelling.  Uvula midline.  Floor of mouth soft.  No sign of periapical abscess.  Normal voice.  Tolerating secretions. Eyes:     Conjunctiva/sclera: Conjunctivae normal.  Cardiovascular:     Rate and Rhythm: Normal rate.  Pulmonary:     Effort: Pulmonary effort is normal. No respiratory distress.  Abdominal:     General: Abdomen is flat.  Musculoskeletal:        General: No deformity.  Skin:    General: Skin is warm and dry.     Capillary Refill: Capillary refill takes less than 2 seconds.  Neurological:     General: No focal deficit present.     Mental Status: She is alert. Mental status is at baseline.  Psychiatric:        Mood and Affect: Mood normal.        Behavior: Behavior normal.      ED Results and Treatments Labs (all labs ordered are listed, but only abnormal results are displayed) Labs Reviewed - No data to display                                                                                                                        Radiology No results found.  Pertinent labs & imaging results that were available during my care of the patient were reviewed by me and considered in my medical decision making (see MDM for details).  Medications Ordered in ED Medications  ketorolac (TORADOL) 15 MG/ML injection 15 mg (has no administration in time range)                                                                                                                                     Procedures Procedures  (including critical care time)  Medical Decision Making / ED Course   MDM:  47 year old  female presenting to the emergency department dental pain.  Patient is well-appearing, she does have multiple sites of fresh tooth extraction.  She does seem uncomfortable.  She reports she has been compliant with over-the-counter pain regimen.  Reviewed PDMP, patient has no narcotic prescriptions, given that she is having uncontrolled pain will give very short course of oxycodone, only 6 tablets.  Advise continuing Tylenol and ibuprofen. [place] Controlled Substance Reporting System database was reviewed. and patient was instructed, not to drive, operate heavy machinery, perform activities at heights, swimming or participation in water activities or provide baby-sitting services while on Pain, Sleep and Anxiety Medications; until their outpatient Physician has advised to do so again. Also recommended to not to take more than prescribed Pain, Sleep and Anxiety Medications.   No evidence of infection on physical exam.  Advised that she should call her oral surgeon tomorrow so that she can get further follow-up.  Will discharge patient to home. All questions  answered. Patient comfortable with plan of discharge. Return precautions discussed with patient and specified on the after visit summary.        Additional history obtained: -Additional history obtained from family     Medicines ordered and prescription drug management: Meds ordered this encounter  Medications   oxyCODONE (ROXICODONE) 5 MG immediate release tablet    Sig: Take 1 tablet (5 mg total) by mouth every 6 (six) hours as needed for breakthrough pain.    Dispense:  6 tablet    Refill:  0   ketorolac (TORADOL) 15 MG/ML injection 15 mg    -I have reviewed the patients home medicines and have made adjustments as needed   Social Determinants of Health:  Diagnosis or treatment significantly limited by social determinants of health: former smoker and obesity   Reevaluation: After the interventions noted above, I reevaluated the patient and found that their symptoms have improved  Co morbidities that complicate the patient evaluation  Past Medical History:  Diagnosis Date   Diabetes mellitus    gestational diabetes-on novolin nph 17 units am and 26 units in pm, fasting generally runs 80-90   Headache(784.0)    History of MRSA infection    history of mrsa wound infection, post op sepsis   History of MRSA infection 2005   post op c/s infection mrsa-[place]      Dispostion: Disposition decision including need for hospitalization was considered, and patient discharged from emergency department.    Final Clinical Impression(s) / ED Diagnoses Final diagnoses:  Post-operative pain     This chart was dictated using voice recognition software.  Despite best efforts to proofread,  errors can occur which can change the documentation meaning.    Lonell Grandchild, MD 01/18/23 2015

## 2023-01-18 NOTE — ED Triage Notes (Signed)
Pt c/o worsening dental pain after having 5 teeth pulled on Wednesday. States that she was given 800mg  ibuprofen without relief. States that she has not been able to eat or drink anything.

## 2023-04-23 ENCOUNTER — Other Ambulatory Visit: Payer: Self-pay

## 2023-04-23 ENCOUNTER — Emergency Department (HOSPITAL_BASED_OUTPATIENT_CLINIC_OR_DEPARTMENT_OTHER): Payer: Medicaid Other

## 2023-04-23 ENCOUNTER — Emergency Department (HOSPITAL_BASED_OUTPATIENT_CLINIC_OR_DEPARTMENT_OTHER)
Admission: EM | Admit: 2023-04-23 | Discharge: 2023-04-23 | Disposition: A | Discharge status: Home / Self Care | Payer: Medicaid Other | Attending: Emergency Medicine | Admitting: Emergency Medicine

## 2023-04-23 ENCOUNTER — Encounter (HOSPITAL_BASED_OUTPATIENT_CLINIC_OR_DEPARTMENT_OTHER): Payer: Self-pay

## 2023-04-23 DIAGNOSIS — M79604 Pain in right leg: Secondary | ICD-10-CM | POA: Diagnosis present

## 2023-04-23 DIAGNOSIS — J45909 Unspecified asthma, uncomplicated: Secondary | ICD-10-CM | POA: Diagnosis not present

## 2023-04-23 DIAGNOSIS — W1840XA Slipping, tripping and stumbling without falling, unspecified, initial encounter: Secondary | ICD-10-CM | POA: Insufficient documentation

## 2023-04-23 DIAGNOSIS — Y9301 Activity, walking, marching and hiking: Secondary | ICD-10-CM | POA: Insufficient documentation

## 2023-04-23 LAB — PREGNANCY, URINE: Preg Test, Ur: NEGATIVE

## 2023-04-23 MED ORDER — DIAZEPAM 5 MG PO TABS
5.0000 mg | ORAL_TABLET | Freq: Once | ORAL | Status: AC
  Administered 2023-04-23: 5 mg via ORAL
  Filled 2023-04-23: qty 1

## 2023-04-23 MED ORDER — KETOROLAC TROMETHAMINE 15 MG/ML IJ SOLN
15.0000 mg | Freq: Once | INTRAMUSCULAR | Status: AC
  Administered 2023-04-23: 15 mg via INTRAMUSCULAR
  Filled 2023-04-23: qty 1

## 2023-04-23 MED ORDER — IBUPROFEN 600 MG PO TABS: 600.0000 mg | ORAL_TABLET | Freq: Four times a day (QID) | ORAL | 0 refills | Status: AC | PRN

## 2023-04-23 MED ORDER — TIZANIDINE HCL 4 MG PO TABS: 4.0000 mg | ORAL_TABLET | Freq: Three times a day (TID) | ORAL | 0 refills | Status: DC | PRN

## 2023-04-23 MED ORDER — KETOROLAC TROMETHAMINE 15 MG/ML IJ SOLN: 15.0000 mg | Freq: Once | INTRAMUSCULAR | Status: DC

## 2023-04-23 NOTE — ED Notes (Signed)
Pt ambulated to bathroom independently

## 2023-04-23 NOTE — ED Notes (Signed)
Discharge instructions discussed with pt. Pt verbalized understanding. Pt stable and ambulatory.  °

## 2023-04-23 NOTE — Discharge Instructions (Signed)
You were seen in the ER today for evaluation of your right leg pain. I am glad that you are feeling better. Please continue to take 1000mg  of Tylenol and 600mg  of ibuprofen every 6 hours as needed for pain. I am also prescribing you a muscle relaxer to take as needed. Please do not drive or operate heavy machinery while on this medication because it can make you sleepy. I would like for you to follow up with your PCP in the next few days for re-evaluation. If you have any swelling, numbness, tingling, color changes, weakness, trouble walking, please return to your nearest ER. If you have any other concerns, new or worsening symptoms, please return to the ER for re-evaluation.   Contact a health care provider if: Your cramps or spasms get more severe or happen more often. Your cramps or spasms do not get better over time.

## 2023-04-23 NOTE — ED Triage Notes (Signed)
Patient arrives with complaints of right leg/posterior thigh pain. Patients states that she may have pulled a muscle earlier today due to almost falling. Rates pain a 6/10.

## 2023-04-23 NOTE — ED Notes (Signed)
Pt ambulated independently from lobby to room

## 2023-04-23 NOTE — ED Provider Notes (Signed)
[place] EMERGENCY DEPARTMENT AT [facility] Provider Note   CSN: [id number] Arrival date & time: 04/23/23  1629     History Chief Complaint  Patient presents with   Leg Pain    Jane Collins is a 47 y.o. female with history of asthma presents emergency room today for evaluation of posterior right leg pain after a slip, without fall.  Patient reports that she was walking with it was raining and her foot slipped and locked and she caught herself with the same leg insert having pain in her posterior right thigh.  She reports that she worked her entire shift at [employer] afterwards but was still having some pain.  No medication taken prior to arrival.  Denies any numbness or tingling.  No pain in her back.  Again, the patient denies that she had any fall or any blunt trauma to the area.  Additionally, she mentions that she has been having some thickened calluses and corns on her feet and would like these evaluated.  No discharge from the area.  Denies any fevers.  She denies any fecal or urinary incontinence.   Leg Pain Associated symptoms: no back pain and no fever        Home Medications Prior to Admission medications   Medication Sig Start Date End Date Taking? Authorizing Provider  ibuprofen (ADVIL) 600 MG tablet Take 1 tablet (600 mg total) by mouth every 6 (six) hours as needed. 04/23/23  Yes Achille Rich, PA-C  tiZANidine (ZANAFLEX) 4 MG tablet Take 1 tablet (4 mg total) by mouth every 8 (eight) hours as needed for muscle spasms. 04/23/23  Yes Achille Rich, PA-C  acetaminophen (TYLENOL) 325 MG tablet Take 650 mg by mouth every 6 (six) hours as needed. Pain/headache     [provider]  oxyCODONE (ROXICODONE) 5 MG immediate release tablet Take 1 tablet (5 mg total) by mouth every 6 (six) hours as needed for breakthrough pain. 01/18/23   Lonell Grandchild, MD  Prenatal Vitamins (DIS) TABS Take by mouth.      [provider]      Allergies    Patient has no  known allergies.    Review of Systems   Review of Systems  Constitutional:  Negative for chills and fever.  Respiratory:  Negative for shortness of breath.   Cardiovascular:  Negative for chest pain.  Gastrointestinal:  Negative for abdominal pain.  Musculoskeletal:  Positive for myalgias. Negative for back pain.  Skin:        Calluses on feet  Neurological:  Negative for numbness.    Physical Exam Updated Vital Signs BP 120/87   Pulse 79   Temp 98.3 F (36.8 C) (Oral)   Resp 18   Ht 5\' 4"  (1.626 m)   Wt 75.3 kg   SpO2 100%   BMI 28.51 kg/m  Physical Exam Vitals and nursing note reviewed.  Constitutional:      General: She is not in acute distress.    Appearance: Normal appearance. She is not ill-appearing or toxic-appearing.  Eyes:     General: No scleral icterus. Pulmonary:     Effort: Pulmonary effort is normal. No respiratory distress.  Musculoskeletal:        General: Tenderness present.     Comments: Tenderness to the posterior aspect of the right thigh.  Palpable muscle spasm.  No overlying skin changes or bruising noted.  No swelling.  No erythema, increase in warmth, induration, or fluctuance present.  Compartments are soft.  She has flexion and extension present of the ankle, knee, and hip with some pain.  She is able to wiggle her toes.  Neurovascular intact distally with palpable DP and PT pulses.  She does have some thickened calluses bilaterally with a corn seen on the more medial aspect near the great toe.  I do not appreciate any other foot wounds or open ulcers.  No lower back tenderness to palpation.  Strength is equal in the patient's bilateral lower extremities.  Skin:    General: Skin is warm and dry.  Neurological:     General: No focal deficit present.     Mental Status: She is alert. Mental status is at baseline.  Psychiatric:        Mood and Affect: Mood normal.     ED Results / Procedures / Treatments   Labs (all labs ordered are listed, but  only abnormal results are displayed) Labs Reviewed  PREGNANCY, URINE    EKG None  Radiology DG Femur Min 2 Views Right  Result Date: 04/23/2023 CLINICAL DATA:  Right hip and leg pain EXAM: RIGHT FEMUR 2 VIEWS; DG HIP (WITH OR WITHOUT PELVIS) 2-3V RIGHT COMPARISON:  None Available. FINDINGS: There is no evidence of fracture or other focal bone lesions. Normal alignment at the hip and knee joint. Mild osteoarthritis of the right knee. Soft tissues are unremarkable. IMPRESSION: Negative. Electronically Signed   By: Duanne Guess D.O.   On: 04/23/2023 19:16   DG Hip Unilat W or Wo Pelvis 2-3 Views Right  Result Date: 04/23/2023 CLINICAL DATA:  Right hip and leg pain EXAM: RIGHT FEMUR 2 VIEWS; DG HIP (WITH OR WITHOUT PELVIS) 2-3V RIGHT COMPARISON:  None Available. FINDINGS: There is no evidence of fracture or other focal bone lesions. Normal alignment at the hip and knee joint. Mild osteoarthritis of the right knee. Soft tissues are unremarkable. IMPRESSION: Negative. Electronically Signed   By: Duanne Guess D.O.   On: 04/23/2023 19:16    Procedures Procedures   Medications Ordered in ED Medications  diazepam (VALIUM) tablet 5 mg (5 mg Oral Given 04/23/23 1848)  ketorolac (TORADOL) 15 MG/ML injection 15 mg (15 mg Intramuscular Given 04/23/23 1848)    ED Course/ Medical Decision Making/ A&P    Medical Decision Making Amount and/or Complexity of Data Reviewed Labs: ordered. Radiology: ordered.  Risk Prescription drug management.   47 y.o. female presents to the ER today for evaluation of posterior thigh pain after slip. Differential diagnosis includes but is not limited to muscle spasm, muscle strain, occult fracture. Vital signs unremarkable. Physical exam as noted above.   I independently reviewed and interpreted the patient's labs.  Pregnancy test negative.  X-ray imaging per radiology read negative.  The patient was given Toradol and Valium.  On reevaluation, she reports her  pain has decreased and is feeling better.  She has been ambulatory to the bathroom and was ambulatory in the emergency department.  She is neurovascular tact distally.  I do not see any obvious deformities.  X-ray imaging is unremarkable.  She has no obvious hematoma or contusion.  I do not see any bruising.  She does not have any lower back pain or any red flag symptoms.  She likely has pulled muscle or muscle spasm.  Given this, will prescribe her some ibuprofen as well as muscle relaxer to go home with.  Stable for discharge home  We discussed plan at bedside. We discussed strict return precautions and red flag symptoms. The patient  verbalized their understanding and agrees to the plan. The patient is stable and being discharged home in good condition.  Portions of this report may have been transcribed using voice recognition software. Every effort was made to ensure accuracy; however, inadvertent computerized transcription errors may be present.   Final Clinical Impression(s) / ED Diagnoses Final diagnoses:  Pain of right lower extremity    Rx / DC Orders ED Discharge Orders          Ordered    tiZANidine (ZANAFLEX) 4 MG tablet  Every 8 hours PRN        04/23/23 2010    ibuprofen (ADVIL) 600 MG tablet  Every 6 hours PRN        04/23/23 2010              Achille Rich, PA-C 04/24/23 1048    Tanda Rockers A, DO 04/25/23 1544

## 2023-09-12 ENCOUNTER — Encounter (HOSPITAL_BASED_OUTPATIENT_CLINIC_OR_DEPARTMENT_OTHER): Payer: Self-pay | Admitting: Emergency Medicine

## 2023-09-12 ENCOUNTER — Emergency Department (HOSPITAL_BASED_OUTPATIENT_CLINIC_OR_DEPARTMENT_OTHER)
Admission: EM | Admit: 2023-09-12 | Discharge: 2023-09-12 | Disposition: A | Discharge status: Home / Self Care | Payer: Medicaid Other

## 2023-09-12 ENCOUNTER — Other Ambulatory Visit: Payer: Self-pay

## 2023-09-12 DIAGNOSIS — R21 Rash and other nonspecific skin eruption: Secondary | ICD-10-CM | POA: Insufficient documentation

## 2023-09-12 MED ORDER — DEXAMETHASONE 4 MG PO TABS
4.0000 mg | ORAL_TABLET | Freq: Once | ORAL | Status: AC
  Administered 2023-09-12: 4 mg via ORAL
  Filled 2023-09-12: qty 1

## 2023-09-12 NOTE — ED Triage Notes (Signed)
Pt in no distress, talking on her phone in the waiting area.

## 2023-09-12 NOTE — Discharge Instructions (Signed)
May take over-the-counter Benadryl for itching.  You may also use Eucerin cream/lotion to help hydrate your dry skin.

## 2023-09-12 NOTE — ED Triage Notes (Signed)
Red rash to face started last night and is now her whole face and neck , onto shoulders. No swallowing issues, benadryl did not help, feels tight and itchy

## 2023-09-12 NOTE — ED Provider Notes (Signed)
[place] EMERGENCY DEPARTMENT AT [facility] Provider Note   CSN: [id number] Arrival date & time: 09/12/23  1409     History  No chief complaint on file.   Jane Collins is a 47 y.o. female.  47 year old female to the emergency department with a rash to her face.  Reports symptoms started yesterday diffusely over face.  Itchy.  No known triggers or inciting factors.  No angioedema difficulty swallowing, difficulty breathing.  No nausea or vomiting.        Home Medications Prior to Admission medications   Medication Sig Start Date End Date Taking? Authorizing Provider  acetaminophen (TYLENOL) 325 MG tablet Take 650 mg by mouth every 6 (six) hours as needed. Pain/headache     [provider]  ibuprofen (ADVIL) 600 MG tablet Take 1 tablet (600 mg total) by mouth every 6 (six) hours as needed. 04/23/23   Achille Rich, PA-C  oxyCODONE (ROXICODONE) 5 MG immediate release tablet Take 1 tablet (5 mg total) by mouth every 6 (six) hours as needed for breakthrough pain. 01/18/23   Lonell Grandchild, MD  Prenatal Vitamins (DIS) TABS Take by mouth.      [provider]  tiZANidine (ZANAFLEX) 4 MG tablet Take 1 tablet (4 mg total) by mouth every 8 (eight) hours as needed for muscle spasms. 04/23/23   Achille Rich, PA-C      Allergies    Patient has no known allergies.    Review of Systems   Review of Systems  Physical Exam Updated Vital Signs BP 120/86 (BP Location: Right Arm)   Pulse 91   Temp 98.5 F (36.9 C)   Resp 16   Wt 66.2 kg   SpO2 98%   BMI 25.06 kg/m  Physical Exam Vitals and nursing note reviewed.  HENT:     Head: Normocephalic.     Nose: Nose normal.     Mouth/Throat:     Mouth: Mucous membranes are moist.     Pharynx: No oropharyngeal exudate or posterior oropharyngeal erythema.  Cardiovascular:     Pulses: Normal pulses.  Pulmonary:     Effort: Pulmonary effort is normal.  Abdominal:     General: Abdomen is flat.      Palpations: Abdomen is soft.  Skin:    Comments: Patient is flushed, some urticarial patches to nose bridge and cheek down to neck.  No mucous membrane involvement.  Neurological:     General: No focal deficit present.     Mental Status: She is alert.  Psychiatric:        Mood and Affect: Mood normal.     ED Results / Procedures / Treatments   Labs (all labs ordered are listed, but only abnormal results are displayed) Labs Reviewed - No data to display  EKG None  Radiology No results found.  Procedures Procedures    Medications Ordered in ED Medications - No data to display  ED Course/ Medical Decision Making/ A&P                                 Medical Decision Making 47 year old female present emergency department with rash.  Afebrile vital signs reassuring.  Does have some urticarial type rash to face and around neck, but also discussing seemingly very dry skin.  Will give Decadron. Discussed continued use of Benadryl for itching.  Encouraged use of moisturizing lotion for eczema component. Stable for discharge.  Final Clinical Impression(s) / ED Diagnoses Final diagnoses:  None    Rx / DC Orders ED Discharge Orders     None         Coral Spikes, DO 09/12/23 1441

## 2023-12-04 ENCOUNTER — Emergency Department (HOSPITAL_BASED_OUTPATIENT_CLINIC_OR_DEPARTMENT_OTHER)
Admission: EM | Admit: 2023-12-04 | Discharge: 2023-12-04 | Disposition: A | Discharge status: Home / Self Care | Attending: Emergency Medicine | Admitting: Emergency Medicine

## 2023-12-04 ENCOUNTER — Other Ambulatory Visit: Payer: Self-pay

## 2023-12-04 ENCOUNTER — Encounter (HOSPITAL_BASED_OUTPATIENT_CLINIC_OR_DEPARTMENT_OTHER): Payer: Self-pay | Admitting: Emergency Medicine

## 2023-12-04 DIAGNOSIS — N939 Abnormal uterine and vaginal bleeding, unspecified: Secondary | ICD-10-CM | POA: Insufficient documentation

## 2023-12-04 LAB — CBC
HCT: 35.4 % — ABNORMAL LOW (ref 36.0–46.0)
Hemoglobin: 12.2 g/dL (ref 12.0–15.0)
MCH: 31.9 pg (ref 26.0–34.0)
MCHC: 34.5 g/dL (ref 30.0–36.0)
MCV: 92.7 fL (ref 80.0–100.0)
Platelets: 340 10*3/uL (ref 150–400)
RBC: 3.82 MIL/uL — ABNORMAL LOW (ref 3.87–5.11)
RDW: 12.4 % (ref 11.5–15.5)
WBC: 11.2 10*3/uL — ABNORMAL HIGH (ref 4.0–10.5)
nRBC: 0 % (ref 0.0–0.2)

## 2023-12-04 LAB — PREGNANCY, URINE: Preg Test, Ur: NEGATIVE

## 2023-12-04 MED ORDER — SODIUM CHLORIDE 0.9 % IV BOLUS
1000.0000 mL | Freq: Once | INTRAVENOUS | Status: AC
  Administered 2023-12-04: 1000 mL via INTRAVENOUS

## 2023-12-04 MED ORDER — MEDROXYPROGESTERONE ACETATE 10 MG PO TABS: ORAL_TABLET | ORAL | 0 refills | Status: DC

## 2023-12-04 NOTE — ED Triage Notes (Signed)
 Pt via pov from home with heavy vaginal bleeding x 2 weeks. Pt states she has never had a period this heavy or this long before and it it making her feel weak. Pt alert & oriented, nad noted.

## 2023-12-04 NOTE — Discharge Instructions (Addendum)
 Take the Provera as prescribed to help stop the vaginal bleeding. It is important that you follow up with gynecology for further evaluation to make sure symptoms have resolved.   Return to the ED as needed.

## 2023-12-04 NOTE — ED Provider Notes (Signed)
 [place] EMERGENCY DEPARTMENT AT [facility] Provider Note   CSN: [id number] Arrival date & time: 12/04/23  1437     History  Chief Complaint  Patient presents with   Vaginal Bleeding    Jane Collins is a 48 y.o. female.  Patient to ED for evaluation of vaginal bleeding that started 14 days ago at the expected time of onset of her menses. History of irregular periods but current symptoms are unusual for her. She described heavy bleeding with clot, saturating 6-7 maxipads daily. No syncope or near syncope but she is starting to feel lightheaded when she stands up, prompting ED evaluation. She has mild lower abdominal discomfort. No urinary symptoms. She denies any concern for pelvic infection or exposure to STDs.   The history is provided by the patient. No language interpreter was used.  Vaginal Bleeding      Home Medications Prior to Admission medications   Medication Sig Start Date End Date Taking? Authorizing Provider  medroxyPROGESTERone (PROVERA) 10 MG tablet Take 2 tablets three times a day for 5 days Then take 2 tablets once daily until seen by gynecology. 12/04/23  Yes Elpidio Anis, PA-C  acetaminophen (TYLENOL) 325 MG tablet Take 650 mg by mouth every 6 (six) hours as needed. Pain/headache     [provider]  ibuprofen (ADVIL) 600 MG tablet Take 1 tablet (600 mg total) by mouth every 6 (six) hours as needed. 04/23/23   Achille Rich, PA-C  oxyCODONE (ROXICODONE) 5 MG immediate release tablet Take 1 tablet (5 mg total) by mouth every 6 (six) hours as needed for breakthrough pain. 01/18/23   Lonell Grandchild, MD  Prenatal Vitamins (DIS) TABS Take by mouth.      [provider]  tiZANidine (ZANAFLEX) 4 MG tablet Take 1 tablet (4 mg total) by mouth every 8 (eight) hours as needed for muscle spasms. 04/23/23   Achille Rich, PA-C      Allergies    Patient has no known allergies.    Review of Systems   Review of Systems  Genitourinary:   Positive for vaginal bleeding.    Physical Exam Updated Vital Signs BP 125/76   Pulse 81   Temp 97.9 F (36.6 C) (Oral)   Resp 17   Ht 5\' 4"  (1.626 m)   Wt 66.2 kg   LMP 11/20/2023 (Approximate)   SpO2 100%   BMI 25.05 kg/m  Physical Exam Constitutional:      General: She is not in acute distress.    Appearance: She is well-developed. She is not ill-appearing.  Pulmonary:     Effort: Pulmonary effort is normal.  Abdominal:     General: There is no distension.     Palpations: Abdomen is soft.     Tenderness: There is no abdominal tenderness.  Musculoskeletal:        General: Normal range of motion.     Cervical back: Normal range of motion.  Skin:    General: Skin is warm and dry.  Neurological:     Mental Status: She is alert and oriented to person, place, and time.     ED Results / Procedures / Treatments   Labs (all labs ordered are listed, but only abnormal results are displayed) Labs Reviewed  CBC - Abnormal; Notable for the following components:      Result Value   WBC 11.2 (*)    RBC 3.82 (*)    HCT 35.4 (*)    All other components within normal  limits  PREGNANCY, URINE    EKG None  Radiology No results found.  Procedures Procedures    Medications Ordered in ED Medications  sodium chloride 0.9 % bolus 1,000 mL (1,000 mLs Intravenous New Bag/Given 12/04/23 1933)    ED Course/ Medical Decision Making/ A&P Clinical Course as of 12/04/23 2123  Fri Dec 04, 2023  2117 Patient with heavy vaginal bleeding x 14 days, starting at the expected date for menses. Starting to feel lightheaded but hemodynamically stable with normal hgb. Not pregnant. IV fluids provided - feels better. Provera started to help stop bleeding. Stressed the importance of follow up with GYN as referred.  [SU]    Clinical Course User Index [SU] Elpidio Anis, PA-C                                 Medical Decision Making Amount and/or Complexity of Data Reviewed Labs:  ordered.           Final Clinical Impression(s) / ED Diagnoses Final diagnoses:  Vaginal bleeding, abnormal    Rx / DC Orders ED Discharge Orders          Ordered    medroxyPROGESTERone (PROVERA) 10 MG tablet        12/04/23 2121              Elpidio Anis, PA-C 12/04/23 2123    Anders Simmonds T, DO 12/04/23 2316

## 2023-12-16 ENCOUNTER — Other Ambulatory Visit: Payer: Self-pay

## 2023-12-16 ENCOUNTER — Encounter (HOSPITAL_BASED_OUTPATIENT_CLINIC_OR_DEPARTMENT_OTHER): Payer: Self-pay

## 2023-12-16 ENCOUNTER — Emergency Department (HOSPITAL_BASED_OUTPATIENT_CLINIC_OR_DEPARTMENT_OTHER)
Admission: EM | Admit: 2023-12-16 | Discharge: 2023-12-16 | Disposition: A | Discharge status: Home / Self Care | Attending: Emergency Medicine | Admitting: Emergency Medicine

## 2023-12-16 DIAGNOSIS — N920 Excessive and frequent menstruation with regular cycle: Secondary | ICD-10-CM | POA: Insufficient documentation

## 2023-12-16 DIAGNOSIS — E119 Type 2 diabetes mellitus without complications: Secondary | ICD-10-CM | POA: Diagnosis not present

## 2023-12-16 LAB — CBC
HCT: 38.7 % (ref 36.0–46.0)
Hemoglobin: 12.8 g/dL (ref 12.0–15.0)
MCH: 31.2 pg (ref 26.0–34.0)
MCHC: 33.1 g/dL (ref 30.0–36.0)
MCV: 94.4 fL (ref 80.0–100.0)
Platelets: 393 10*3/uL (ref 150–400)
RBC: 4.1 MIL/uL (ref 3.87–5.11)
RDW: 12.7 % (ref 11.5–15.5)
WBC: 12 10*3/uL — ABNORMAL HIGH (ref 4.0–10.5)
nRBC: 0 % (ref 0.0–0.2)

## 2023-12-16 LAB — URINALYSIS, ROUTINE W REFLEX MICROSCOPIC
Bilirubin Urine: NEGATIVE
Glucose, UA: NEGATIVE mg/dL
Ketones, ur: NEGATIVE mg/dL
Nitrite: NEGATIVE
Protein, ur: 100 mg/dL — AB
Specific Gravity, Urine: >1.03 — ABNORMAL HIGH (ref 1.005–1.030)
pH: 6 (ref 5.0–8.0)

## 2023-12-16 LAB — BASIC METABOLIC PANEL WITH GFR
Anion gap: 10 (ref 5–15)
BUN: 8 mg/dL (ref 6–20)
CO2: 26 mmol/L (ref 22–32)
Calcium: 9.6 mg/dL (ref 8.9–10.3)
Chloride: 103 mmol/L (ref 98–111)
Creatinine, Ser: 0.7 mg/dL (ref 0.44–1.00)
GFR, Estimated: >60 mL/min (ref >60)
Glucose, Bld: 124 mg/dL — ABNORMAL HIGH (ref 70–99)
Potassium: 3.7 mmol/L (ref 3.5–5.1)
Sodium: 139 mmol/L (ref 135–145)

## 2023-12-16 LAB — URINALYSIS, MICROSCOPIC (REFLEX)

## 2023-12-16 LAB — PREGNANCY, URINE: Preg Test, Ur: NEGATIVE

## 2023-12-16 MED ORDER — MEDROXYPROGESTERONE ACETATE 5 MG PO TABS: 10.0000 mg | ORAL_TABLET | Freq: Three times a day (TID) | ORAL | 0 refills | Status: DC

## 2023-12-16 NOTE — ED Provider Notes (Signed)
  EMERGENCY DEPARTMENT AT [facility] Provider Note   CSN: [id number] Arrival date & time: 12/16/23  2107     History  Chief Complaint  Patient presents with   Vaginal Bleeding    Jane Collins is a 48 y.o. female with history of diabetes, presents with concern for heavy menstrual bleeding.  States her period started 3 days ago and has been heavier than normal.  She denies feeling any shortness of breath, weakness, or lightheadedness.  She reports having abnormal vaginal bleeding a couple weeks ago which was not at her regular menses time. Reports she was given Provera which helped stop the bleeding. Has not been able to follow-up with GYN as she states she does not have a provider.  She denies any exposure to STDs.  Denies any dysuria or increased frequency.   Vaginal Bleeding      Home Medications Prior to Admission medications   Medication Sig Start Date End Date Taking? Authorizing Provider  medroxyPROGESTERone (PROVERA) 5 MG tablet Take 2 tablets (10 mg total) by mouth 3 (three) times daily for 5 days. 12/16/23 12/21/23 Yes Arabella Merles, PA-C  acetaminophen (TYLENOL) 325 MG tablet Take 650 mg by mouth every 6 (six) hours as needed. Pain/headache     [provider]  ibuprofen (ADVIL) 600 MG tablet Take 1 tablet (600 mg total) by mouth every 6 (six) hours as needed. 04/23/23   Achille Rich, PA-C  oxyCODONE (ROXICODONE) 5 MG immediate release tablet Take 1 tablet (5 mg total) by mouth every 6 (six) hours as needed for breakthrough pain. 01/18/23   Lonell Grandchild, MD  Prenatal Vitamins (DIS) TABS Take by mouth.      [provider]  tiZANidine (ZANAFLEX) 4 MG tablet Take 1 tablet (4 mg total) by mouth every 8 (eight) hours as needed for muscle spasms. 04/23/23   Achille Rich, PA-C      Allergies    Patient has no known allergies.    Review of Systems   Review of Systems  Genitourinary:  Positive for vaginal bleeding.    Physical  Exam Updated Vital Signs BP 121/73 (BP Location: Right Arm)   Pulse 79   Temp 98.5 F (36.9 C) (Oral)   Resp 17   LMP 12/11/2023 (Approximate)   SpO2 95%  Physical Exam Vitals and nursing note reviewed.  Constitutional:      General: She is not in acute distress.    Appearance: She is well-developed.  HENT:     Head: Normocephalic and atraumatic.  Eyes:     Conjunctiva/sclera: Conjunctivae normal.  Cardiovascular:     Rate and Rhythm: Normal rate and regular rhythm.     Heart sounds: No murmur heard. Pulmonary:     Effort: Pulmonary effort is normal. No respiratory distress.     Breath sounds: Normal breath sounds.  Abdominal:     Palpations: Abdomen is soft.     Tenderness: There is no abdominal tenderness.  Genitourinary:    Comments: Declines GU exam Musculoskeletal:        General: No swelling.     Cervical back: Neck supple.  Skin:    General: Skin is warm and dry.     Capillary Refill: Capillary refill takes less than 2 seconds.  Neurological:     Mental Status: She is alert.  Psychiatric:        Mood and Affect: Mood normal.     ED Results / Procedures / Treatments   Labs (all labs  ordered are listed, but only abnormal results are displayed) Labs Reviewed  CBC - Abnormal; Notable for the following components:      Result Value   WBC 12.0 (*)    All other components within normal limits  BASIC METABOLIC PANEL WITH GFR - Abnormal; Notable for the following components:   Glucose, Bld 124 (*)    All other components within normal limits  URINALYSIS, ROUTINE W REFLEX MICROSCOPIC - Abnormal; Notable for the following components:   Color, Urine ORANGE (*)    Specific Gravity, Urine >1.030 (*)    Hgb urine dipstick LARGE (*)    Protein, ur 100 (*)    Leukocytes,Ua TRACE (*)    All other components within normal limits  URINALYSIS, MICROSCOPIC (REFLEX) - Abnormal; Notable for the following components:   Bacteria, UA RARE (*)    All other components within  normal limits  PREGNANCY, URINE    EKG None  Radiology No results found.  Procedures Procedures    Medications Ordered in ED Medications - No data to display  ED Course/ Medical Decision Making/ A&P                                 Medical Decision Making Amount and/or Complexity of Data Reviewed Labs: ordered.     Differential diagnosis includes but is not limited to abnormal vaginal bleeding, pregnancy, blood loss anemia  ED Course:  Patient well-appearing, stable vital signs.  No abdominal tenderness to palpation.  Declines GU exam. I Ordered, and personally interpreted labs.  The pertinent results include:   CBC with stable hemoglobin at 12.8, leukocytosis of 12.0 BMP with no electrolyte abnormalities or increase in creatinine Urinalysis with high specific gravity, large amount of hemoglobin and red blood cells noted.  No white blood cells or nitrites Pregnancy negative Patient reports this is consistent with normal timing of menses, but bleeding is heavier than normal.  She does not have any drop in her hemoglobin.  She states she is asymptomatic without any dizziness, shortness of breath, or feelings of lightheadedness or weakness.  Discussed that since her hemoglobin is stable and she is having no symptoms, can monitor, or can provide short course of Provera to help with the heaviness of her cycle.  She would like the Provera prescription and states she will start this if the bleeding does not start to improve within the next couple of days.  Discussed that she needs to follow-up with GYN regarding her heavy bleeding for further management.  I will place another referral for her, and also include contact information for local offices in her discharge paperwork.  She denies any concern for STIs.  Urine without signs of infection.  Low concern for intra-abdominal abnormality given no abdominal tenderness to palpation and leukocytosis is stable since her last visit 12 days  ago with no acute worsening in symptoms.   Stable and appropriate for discharge home at this time   Impression: Heavy menstrual bleeding  Disposition:  The patient was discharged home with instructions to follow-up with gynecology as soon as possible for further evaluation and management of her heavy menstrual cycles.  Take Provera as prescribed if her bleeding does not start to improve over the next couple days, or if she becomes symptomatic. Return precautions given.    Record Review: External records from outside source obtained and reviewed including ER note from 12/04/2023 where she was seen for heavy  vaginal bleeding and prescribed Provera     This chart was dictated using voice recognition software, Dragon. Despite the best efforts of this provider to proofread and correct errors, errors may still occur which can change documentation meaning.          Final Clinical Impression(s) / ED Diagnoses Final diagnoses:  Menorrhagia with regular cycle    Rx / DC Orders ED Discharge Orders          Ordered    medroxyPROGESTERone (PROVERA) 5 MG tablet  3 times daily        12/16/23 2334              Arabella Merles, PA-C 12/16/23 2339    Terald Sleeper, MD 12/17/23 [phone]

## 2023-12-16 NOTE — ED Triage Notes (Signed)
 Pt presents via POV c/o vaginal bleeding. Reports has been seen here previously for same. Reports bleeding started on Saturday but usual has period at end of the month so may be her normal menstrual cycle. A&O x4. Ambulatory to triage.

## 2023-12-16 NOTE — Discharge Instructions (Addendum)
 Your blood counts today were normal, you do not have any drop in your hemoglobin.  If your bleeding continues to be heavy, or if you start to develop symptoms such as weakness, lightheadedness, dizziness, please start the Provera as prescribed to help stop the bleeding.  As discussed, you need to follow-up with a gynecologist to discuss your heavy menstrual cycles and for further management.  Please contact one the OB/GYN offices below and schedule an appointment for yourself.  Your kidney function was normal today.  Your electrolytes are normal.  Your urine did not show any signs of infection.  Your pregnancy test negative.  Return to the ER if you have any dizziness, lightheadedness, loss of consciousness, any other new or concerning symptoms.

## 2024-01-13 ENCOUNTER — Other Ambulatory Visit (HOSPITAL_COMMUNITY)
Admission: RE | Admit: 2024-01-13 | Discharge: 2024-01-13 | Disposition: A | Discharge status: Home / Self Care | Source: Ambulatory Visit | Attending: Obstetrics and Gynecology | Admitting: Obstetrics and Gynecology

## 2024-01-13 ENCOUNTER — Encounter: Payer: Self-pay | Admitting: Obstetrics and Gynecology

## 2024-01-13 ENCOUNTER — Ambulatory Visit: Payer: Self-pay | Admitting: Obstetrics and Gynecology

## 2024-01-13 VITALS — BP 98/62 | HR 65 | Ht 63.75 in | Wt 142.0 lb

## 2024-01-13 DIAGNOSIS — N760 Acute vaginitis: Secondary | ICD-10-CM | POA: Diagnosis not present

## 2024-01-13 DIAGNOSIS — N939 Abnormal uterine and vaginal bleeding, unspecified: Secondary | ICD-10-CM | POA: Insufficient documentation

## 2024-01-13 DIAGNOSIS — N898 Other specified noninflammatory disorders of vagina: Secondary | ICD-10-CM

## 2024-01-13 LAB — WET PREP FOR TRICH, YEAST, CLUE

## 2024-01-13 MED ORDER — NORETHINDRONE 0.35 MG PO TABS
1.0000 | ORAL_TABLET | Freq: Every day | ORAL | 11 refills | Status: AC
Start: 2024-01-13 — End: ?

## 2024-01-13 MED ORDER — METRONIDAZOLE 0.75 % VA GEL: 1.0000 | Freq: Every day | VAGINAL | 0 refills | Status: AC

## 2024-01-13 NOTE — Progress Notes (Signed)
 48 y.o. [id number] female with history of C-section x 2 here for consult for menorrhagia. Single.  Patient's last menstrual period was 12/11/2023 (approximate).   She reports bleeding from Feb ended recently in April.  On 4-25 she went to ED and was given provera , since then has stopped. No pelvic pain. Notes some vaginal irritation. 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 Norma cycles until Feb. No recent Gyn care or MMG. Recently obtained Medicaid insurance.  Birth control: condoms Sexually active: yes, relationship x5 months. Reports neg STD testing since new partner. Smoker: former, occasional MJ History of migraine without aura.  Denies DVT or hypertension.  GYN HISTORY: 2 prior C-sections  OB History  Gravida Para Term Preterm AB Living  5 2 2  0 3 2  SAB IAB Ectopic Multiple Live Births  3 0 0 0 2    # Outcome Date GA Lbr Len/2nd Weight Sex Type Anes PTL Lv  5 Term 07/23/11 [redacted]w[redacted]d  6 lb 11.8 oz (3.056 kg) F CS-LTranv Spinal  LIV  4 Term 2007    F CS-Unspec   LIV  3 SAB           2 SAB           1 SAB             Past Medical History:  Diagnosis Date   Diabetes mellitus    gestational diabetes-on novolin nph 17 units am and 26 units in pm, fasting generally runs 80-90   Headache(784.0)    History of MRSA infection    history of mrsa wound infection, post op sepsis   History of MRSA infection 2005   post op c/s infection mrsa-[place]    Past Surgical History:  Procedure Laterality Date   CESAREAN SECTION     CESAREAN SECTION  07/23/2011   Procedure: CESAREAN SECTION;  Surgeon: Oddis Bench;  Location: [facility];  Service: Gynecology;  Laterality: N/A;   DILATION AND CURETTAGE OF UTERUS     x3 after sab   HERNIA REPAIR      Current Outpatient Medications on File Prior to Visit  Medication Sig Dispense Refill   acetaminophen  (TYLENOL ) 325 MG tablet Take 650 mg by mouth every 6 (six) hours as  needed. Pain/headache      citalopram (CELEXA) 20 MG tablet Take 20 mg by mouth daily.     ibuprofen  (ADVIL ) 600 MG tablet Take 1 tablet (600 mg total) by mouth every 6 (six) hours as needed. 30 tablet 0   No current facility-administered medications on file prior to visit.    No Known Allergies    PE Today's Vitals   01/13/24 0918  BP: 98/62  Pulse: 65  SpO2: 99%  Weight: 142 lb (64.4 kg)  Height: 5' 3.75" (1.619 m)   Body mass index is 24.57 kg/m.  Physical Exam Vitals reviewed. Exam conducted with a chaperone present.  Constitutional:      General: She is not in acute distress.    Appearance: Normal appearance.  HENT:     Head: Normocephalic and atraumatic.     Nose: Nose normal.  Eyes:     Extraocular Movements: Extraocular movements intact.     Conjunctiva/sclera: Conjunctivae normal.  Pulmonary:     Effort: Pulmonary effort is normal.  Genitourinary:    General: Normal vulva.     Exam position: Lithotomy position.     Vagina: Normal. No vaginal  discharge.     Cervix: Normal. No cervical motion tenderness, discharge or lesion.     Uterus: Normal. Not enlarged and not tender.      Adnexa: Right adnexa normal and left adnexa normal.  Musculoskeletal:        General: Normal range of motion.     Cervical back: Normal range of motion.  Neurological:     General: No focal deficit present.     Mental Status: She is alert.  Psychiatric:        Mood and Affect: Mood normal.        Behavior: Behavior normal.      Assessment and Plan:        Abnormal uterine bleeding (AUB) Assessment & Plan: Will complete hormonal work-up, US  and EMB if >45yo or risk factors for endometrial hyperplasia or malignancy. Still with Provera .  Given recently sexually active, will transition to p.o. P. RTO for US  + EMB. Will discuss further management at that time.   Orders: -     Thyroid Panel With TSH -     FSH/LH -     Prolactin -     Testos,Total,Free and SHBG (Female) -      US  PELVIS TRANSVAGINAL NON-OB (TV ONLY); Future -     Endometrial biopsy; Future -     Cytology - PAP -     Norethindrone; Take 1 tablet (0.35 mg total) by mouth daily.  Dispense: 28 tablet; Refill: 11 -     hCG, quantitative, pregnancy  Vaginal discharge -     WET PREP FOR TRICH, YEAST, CLUE     Romaine Closs, MD

## 2024-01-13 NOTE — Assessment & Plan Note (Signed)
 Will complete hormonal work-up, US  and EMB if >48yo or risk factors for endometrial hyperplasia or malignancy. Still with Provera .  Given recently sexually active, will transition to p.o. P. RTO for US  + EMB. Will discuss further management at that time.

## 2024-01-14 ENCOUNTER — Encounter: Payer: Self-pay | Admitting: Obstetrics and Gynecology

## 2024-01-14 LAB — CYTOLOGY - PAP
Comment: NEGATIVE
Comment: NEGATIVE
Comment: NEGATIVE
HPV 16: NEGATIVE
HPV 18 / 45: NEGATIVE
High risk HPV: POSITIVE — AB

## 2024-01-18 LAB — TESTOS,TOTAL,FREE AND SHBG (FEMALE)
Free Testosterone: 1 pg/mL (ref 0.1–6.4)
Sex Hormone Binding: 32 nmol/L (ref 17–124)
Testosterone, Total, LC-MS-MS: 8 ng/dL (ref 2–45)

## 2024-01-18 LAB — PROLACTIN: Prolactin: 19 ng/mL

## 2024-01-18 LAB — FSH/LH
FSH: 49.1 m[IU]/mL
LH: 24.5 m[IU]/mL

## 2024-01-18 LAB — THYROID PANEL WITH TSH
Free Thyroxine Index: 2.3 (ref 1.4–3.8)
T3 Uptake: 30 % (ref 22–35)
T4, Total: 7.5 ug/dL (ref 5.1–11.9)
TSH: 0.69 m[IU]/L

## 2024-01-18 LAB — HCG, QUANTITATIVE, PREGNANCY: HCG, Total, QN: <5 m[IU]/mL

## 2024-01-20 ENCOUNTER — Encounter: Payer: Self-pay | Admitting: Obstetrics and Gynecology

## 2024-01-21 ENCOUNTER — Encounter: Payer: Self-pay | Admitting: Obstetrics and Gynecology

## 2024-01-21 ENCOUNTER — Ambulatory Visit (INDEPENDENT_AMBULATORY_CARE_PROVIDER_SITE_OTHER): Admitting: Obstetrics and Gynecology

## 2024-01-21 VITALS — BP 102/64 | HR 76 | Temp 98.4°F | Wt 147.0 lb

## 2024-01-21 DIAGNOSIS — N951 Menopausal and female climacteric states: Secondary | ICD-10-CM | POA: Insufficient documentation

## 2024-01-21 DIAGNOSIS — R8789 Other abnormal findings in specimens from female genital organs: Secondary | ICD-10-CM | POA: Diagnosis not present

## 2024-01-21 DIAGNOSIS — R87622 Low grade squamous intraepithelial lesion on cytologic smear of vagina (LGSIL): Secondary | ICD-10-CM | POA: Diagnosis not present

## 2024-01-21 NOTE — Progress Notes (Signed)
 48 y.o. Jane Collins female with history of C-section x 2, menorrhagia here for results discussion. Divorced. Single, relationship x 2 months.  Patient's last menstrual period was 01/13/2024 (approximate).   At 01/13/2024 appointment, she noted: She reports bleeding from Feb ended recently in April.  On 4-25 she went to ED and was given provera , since then has stopped. No pelvic pain. Notes some vaginal irritation. 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 Norma cycles until Feb. No recent Gyn care or MMG. Recently obtained Medicaid insurance.  Birth control: condoms Sexually active: yes, relationship x2 months. Reports neg STD testing since new partner. Smoker: former, occasional MJ History of migraine without aura.  Denies DVT or hypertension.  Workup notable for: 01/13/2024 elevated FSH, LH C/W perimenopause 01/13/2024 low normal testosterone; TSH, prolactin, hCG normal     Component Value Date/Time   DIAGPAP - Low grade squamous intraepithelial lesion (LSIL) (A) 01/13/2024 1005   HPVHIGH Positive (A) 01/13/2024 1005   ADEQPAP  01/13/2024 1005    Satisfactory for evaluation; transformation zone component PRESENT.   GYN HISTORY: C-section x 2  OB History  Gravida Para Term Preterm AB Living  5 2 2  0 3 2  SAB IAB Ectopic Multiple Live Births  3 0 0 0 2    # Outcome Date GA Lbr Len/2nd Weight Sex Type Anes PTL Lv  5 Term 07/23/11 [redacted]w[redacted]d  6 lb 11.8 oz (3.056 kg) F CS-LTranv Spinal  LIV  4 Term 2007    F CS-Unspec   LIV  3 SAB           2 SAB           1 SAB             Past Medical History:  Diagnosis Date   Diabetes mellitus    gestational diabetes-on novolin nph 17 units am and 26 units in pm, fasting generally runs 80-90   Headache(784.0)    History of MRSA infection    history of mrsa wound infection, post op sepsis   History of MRSA infection 2005   post op c/s infection mrsa-    Past  Surgical History:  Procedure Laterality Date   CESAREAN SECTION     CESAREAN SECTION  07/23/2011   Procedure: CESAREAN SECTION;  Surgeon: Oddis Bench;  Location: [facility];  Service: Gynecology;  Laterality: N/A;   DILATION AND CURETTAGE OF UTERUS     x3 after sab   HERNIA REPAIR      Current Outpatient Medications on File Prior to Visit  Medication Sig Dispense Refill   acetaminophen  (TYLENOL ) 325 MG tablet Take 650 mg by mouth every 6 (six) hours as needed. Pain/headache      citalopram (CELEXA) 20 MG tablet Take 20 mg by mouth daily.     gabapentin (NEURONTIN) 100 MG capsule Take 200 mg by mouth.     ibuprofen  (ADVIL ) 600 MG tablet Take 1 tablet (600 mg total) by mouth every 6 (six) hours as needed. 30 tablet 0   norethindrone  (ORTHO MICRONOR ) 0.35 MG tablet Take 1 tablet (0.35 mg total) by mouth daily. 28 tablet 11   VENTOLIN HFA 108 (90 Base) MCG/ACT inhaler Inhale 2 puffs into the lungs.     No current facility-administered medications on file prior to visit.    No Known Allergies    PE Today's Vitals   01/21/24 1416  BP: 102/64  Pulse: 76  Temp: 98.4 F (36.9 C)  TempSrc: Oral  SpO2: 98%  Weight: 147 lb (66.7 kg)   Body mass index is 25.43 kg/m.  Physical Exam Vitals reviewed.  Constitutional:      General: She is not in acute distress.    Appearance: Normal appearance.  HENT:     Head: Normocephalic and atraumatic.     Nose: Nose normal.  Eyes:     Extraocular Movements: Extraocular movements intact.     Conjunctiva/sclera: Conjunctivae normal.  Pulmonary:     Effort: Pulmonary effort is normal.  Musculoskeletal:        General: Normal range of motion.     Cervical back: Normal range of motion.  Neurological:     General: No focal deficit present.     Mental Status: She is alert.  Psychiatric:        Mood and Affect: Mood normal.        Behavior: Behavior normal.      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 Reviewed HPV infection in association with cervical cancer. ASCCP guidelines reviewed. Recommend colposcopy.  Patient to take ibuprofen  30 minutes prior to appointment.  All questions answered. -     Colposcopy; Future  Perimenopause Elevated FSH, LH and low normal testosterone.  Consistent with perimenopause and patient with ongoing menses. Continue POP at this time. Return to office for ultrasound, EMB plus colpo.    Romaine Closs, MD

## 2024-02-02 ENCOUNTER — Encounter: Payer: Self-pay | Admitting: Obstetrics and Gynecology

## 2024-02-02 ENCOUNTER — Ambulatory Visit (INDEPENDENT_AMBULATORY_CARE_PROVIDER_SITE_OTHER)

## 2024-02-02 ENCOUNTER — Ambulatory Visit (INDEPENDENT_AMBULATORY_CARE_PROVIDER_SITE_OTHER): Admitting: Obstetrics and Gynecology

## 2024-02-02 ENCOUNTER — Other Ambulatory Visit (HOSPITAL_COMMUNITY)
Admission: RE | Admit: 2024-02-02 | Discharge: 2024-02-02 | Disposition: A | Discharge status: Home / Self Care | Source: Ambulatory Visit | Attending: Obstetrics and Gynecology | Admitting: Obstetrics and Gynecology

## 2024-02-02 VITALS — BP 112/64 | HR 77 | Temp 98.2°F | Wt 153.0 lb

## 2024-02-02 DIAGNOSIS — N939 Abnormal uterine and vaginal bleeding, unspecified: Secondary | ICD-10-CM

## 2024-02-02 DIAGNOSIS — Z01812 Encounter for preprocedural laboratory examination: Secondary | ICD-10-CM | POA: Diagnosis not present

## 2024-02-02 DIAGNOSIS — R8789 Other abnormal findings in specimens from female genital organs: Secondary | ICD-10-CM | POA: Insufficient documentation

## 2024-02-02 DIAGNOSIS — N898 Other specified noninflammatory disorders of vagina: Secondary | ICD-10-CM

## 2024-02-02 DIAGNOSIS — R87622 Low grade squamous intraepithelial lesion on cytologic smear of vagina (LGSIL): Secondary | ICD-10-CM | POA: Diagnosis present

## 2024-02-02 DIAGNOSIS — B3731 Acute candidiasis of vulva and vagina: Secondary | ICD-10-CM | POA: Diagnosis not present

## 2024-02-02 LAB — PREGNANCY, URINE: Preg Test, Ur: NEGATIVE

## 2024-02-02 LAB — WET PREP FOR TRICH, YEAST, CLUE

## 2024-02-02 MED ORDER — LIDOCAINE HCL (PF) 1 % IJ SOLN: 10.0000 mL | Freq: Once | INTRAMUSCULAR | Status: AC

## 2024-02-02 MED ORDER — FLUCONAZOLE 150 MG PO TABS: 150.0000 mg | ORAL_TABLET | Freq: Once | ORAL | 0 refills | Status: AC

## 2024-02-02 NOTE — Progress Notes (Addendum)
 48 y.o. [id number] femalee with history of C-section x 2, menorrhagia, AUB, abnormal PAP here for TVUS, EMB, and colposcopy. Divorced. Single, relationship x 2 months.  Patient's last menstrual period was 01/13/2024 (approximate).   At 01/13/2024 appointment, she noted: She reports bleeding from Feb ended recently in April.  On 4-25 she went to ED and was given provera , since then has stopped. No pelvic pain. Notes some vaginal irritation. 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 Norma cycles until Feb. No recent Gyn care or MMG. Recently obtained Medicaid insurance.  Birth control: condoms Sexually active: yes, relationship x2 months. Reports neg STD testing since new partner. Smoker: former, occasional MJ History of migraine without aura.  Denies DVT or hypertension.  Workup notable for: 01/13/2024 elevated FSH, LH C/W perimenopause 01/13/2024 low normal testosterone; TSH, prolactin, hCG normal     Component Value Date/Time   DIAGPAP - Low grade squamous intraepithelial lesion (LSIL) (A) 01/13/2024 1005   HPVHIGH Positive (A) 01/13/2024 1005   ADEQPAP  01/13/2024 1005    Satisfactory for evaluation; transformation zone component PRESENT.   GYN HISTORY: C-section x 2  OB History  Gravida Para Term Preterm AB Living  5 2 2  0 3 2  SAB IAB Ectopic Multiple Live Births  3 0 0 0 2    # Outcome Date GA Lbr Len/2nd Weight Sex Type Anes PTL Lv  5 Term 07/23/11 [redacted]w[redacted]d  6 lb 11.8 oz (3.056 kg) F CS-LTranv Spinal  LIV  4 Term 2007    F CS-Unspec   LIV  3 SAB           2 SAB           1 SAB             Past Medical History:  Diagnosis Date   Diabetes mellitus    gestational diabetes-on novolin nph 17 units am and 26 units in pm, fasting generally runs 80-90   Headache(784.0)    History of MRSA infection    history of mrsa wound infection, post op sepsis   History of MRSA infection 2005   post op c/s infection  mrsa-[place]    Past Surgical History:  Procedure Laterality Date   CESAREAN SECTION     CESAREAN SECTION  07/23/2011   Procedure: CESAREAN SECTION;  Surgeon: Oddis Bench;  Location: [facility];  Service: Gynecology;  Laterality: N/A;   DILATION AND CURETTAGE OF UTERUS     x3 after sab   HERNIA REPAIR      Current Outpatient Medications on File Prior to Visit  Medication Sig Dispense Refill   acetaminophen  (TYLENOL ) 325 MG tablet Take 650 mg by mouth every 6 (six) hours as needed. Pain/headache      citalopram (CELEXA) 20 MG tablet Take 20 mg by mouth daily.     gabapentin (NEURONTIN) 100 MG capsule Take 200 mg by mouth.     ibuprofen  (ADVIL ) 600 MG tablet Take 1 tablet (600 mg total) by mouth every 6 (six) hours as needed. 30 tablet 0   norethindrone  (ORTHO MICRONOR ) 0.35 MG tablet Take 1 tablet (0.35 mg total) by mouth daily. 28 tablet 11   VENTOLIN HFA 108 (90 Base) MCG/ACT inhaler Inhale 2 puffs into the lungs.     No current facility-administered medications on file prior to visit.    No Known Allergies    PE Today's Vitals   02/02/24 1512  BP:  112/64  Pulse: 77  Temp: 98.2 F (36.8 C)  TempSrc: Oral  SpO2: 99%  Weight: 153 lb (69.4 kg)   Body mass index is 26.47 kg/m.  Physical Exam Vitals reviewed. Exam conducted with a chaperone present.  Constitutional:      General: She is not in acute distress.    Appearance: Normal appearance.  HENT:     Head: Normocephalic and atraumatic.     Nose: Nose normal.  Eyes:     Extraocular Movements: Extraocular movements intact.     Conjunctiva/sclera: Conjunctivae normal.  Pulmonary:     Effort: Pulmonary effort is normal.  Genitourinary:    General: Normal vulva.     Exam position: Lithotomy position.     Vagina: Normal. No vaginal discharge.     Cervix: Normal. No cervical motion tenderness, discharge or lesion.     Uterus: Normal. Not enlarged and not tender.      Adnexa: Right adnexa normal and left adnexa  normal.  Musculoskeletal:        General: Normal range of motion.     Cervical back: Normal range of motion.  Neurological:     General: No focal deficit present.     Mental Status: She is alert.  Psychiatric:        Mood and Affect: Mood normal.        Behavior: Behavior normal.     02/02/24 TVUS Indications: Abnormal uterine bleeding  Findings:   Uterus: 7.0 x 4.1 x 3.1 cm, anteverted. Endometrial thickness: 2.8 mm. Left ovary: 2.6 x 1.2 x 1.2 cm, normal-appearing. Right ovary: 2.2 x 1.0 x 1.3 cm, normal-appearing. No free fluid.  Impression:  Normal transvaginal ultrasound.  Romaine Closs, MD  Procedure: Endometrial Biopsy, Colposcopy w/ Cervical Biopsies  Consented for procedure.  Time out performed. Speculum placed in vagina.  Betadine was applied to the cervix. Cervical block was performed with 10cc 1% lidocaine (Lot#: [id number], Exp Sept 2026). Acetic acid 3% was applied to cervix.  A single-toothed tenaculum was placed on the anterior lip of the cervix to stabilize it.  Satisfactory colposcopy.  Biopsies taken Yes.   Location(s) - 12:00, 6:00 +atypical vascularity   The 3 mm pipelle was introduced into the endometrial cavity without difficulty to a depth of 7cm, suction initiated and a moderate amount of tissue was obtained.   ECC was performed. Specimens to pathology separately.  Monsel's applied to biopsy areas.  Hemostasis was adequate.  The instruments were removed from the patient's vagina.  Minimal bleeding from the cervix was noted.  The patient tolerated the procedure well.      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 ASCCP guidelines reviewed. Consents signed. Satisfactory colposcopy, ECC and biopsy performed.   Aftercare instructions provided.  Will call with results. I also recommend completion of Gardasil vaccine up to age 48yo, avoidance of smoking, and use of condoms with new sexual partners to prevent progression of disease.   Abnormal uterine bleeding (AUB) -     Lidocaine HCl (PF) -     Surgical pathology Uncx EMB. Continue POP, RTO in 3 months for f/u.  Vaginal discharge -     WET PREP FOR TRICH, YEAST, CLUE  Yeast  vaginitis -     Fluconazole; Take 1 tablet (150 mg total) by mouth once for 1 dose.  Dispense: 1 tablet; Refill: 0    Romaine Closs, MD

## 2024-02-02 NOTE — Patient Instructions (Signed)
 It is common to have vaginal bleeding and cramping for up to 72 hours after your biopsy. Please call our office with heavy vaginal bleeding, severe abdominal pain or fever. Avoid intercourse, tampon use, douching and baths for 7 days to decrease the risk of infection.

## 2024-02-05 LAB — SURGICAL PATHOLOGY

## 2024-02-09 ENCOUNTER — Ambulatory Visit: Payer: Self-pay | Admitting: Obstetrics and Gynecology

## 2024-02-09 ENCOUNTER — Telehealth: Payer: Self-pay

## 2024-02-09 NOTE — Telephone Encounter (Signed)
 Patient called triage line stating that she had a biopsy done last Tuesday & was just calling to see if the results were ready. Patient is aware that once Dr. Andrena Ke reviews the results that she will be called to be notified. Patient states she prefers a phone call instead of mychart notification.

## 2024-02-09 NOTE — Telephone Encounter (Signed)
 Patient notified of results.

## 2024-06-14 ENCOUNTER — Encounter (HOSPITAL_BASED_OUTPATIENT_CLINIC_OR_DEPARTMENT_OTHER): Payer: Self-pay

## 2024-06-14 ENCOUNTER — Emergency Department (HOSPITAL_BASED_OUTPATIENT_CLINIC_OR_DEPARTMENT_OTHER)

## 2024-06-14 ENCOUNTER — Other Ambulatory Visit: Payer: Self-pay

## 2024-06-14 ENCOUNTER — Emergency Department (HOSPITAL_BASED_OUTPATIENT_CLINIC_OR_DEPARTMENT_OTHER)
Admission: EM | Admit: 2024-06-14 | Discharge: 2024-06-14 | Disposition: A | Discharge status: Home / Self Care | Attending: Emergency Medicine | Admitting: Emergency Medicine

## 2024-06-14 DIAGNOSIS — S90121A Contusion of right lesser toe(s) without damage to nail, initial encounter: Secondary | ICD-10-CM | POA: Diagnosis not present

## 2024-06-14 DIAGNOSIS — E119 Type 2 diabetes mellitus without complications: Secondary | ICD-10-CM | POA: Insufficient documentation

## 2024-06-14 DIAGNOSIS — Z7984 Long term (current) use of oral hypoglycemic drugs: Secondary | ICD-10-CM | POA: Diagnosis not present

## 2024-06-14 DIAGNOSIS — S99921A Unspecified injury of right foot, initial encounter: Secondary | ICD-10-CM | POA: Diagnosis present

## 2024-06-14 DIAGNOSIS — W2203XA Walked into furniture, initial encounter: Secondary | ICD-10-CM | POA: Insufficient documentation

## 2024-06-14 MED ORDER — IBUPROFEN 800 MG PO TABS
800.0000 mg | ORAL_TABLET | Freq: Once | ORAL | Status: AC
  Administered 2024-06-14: 800 mg via ORAL
  Filled 2024-06-14: qty 1

## 2024-06-14 NOTE — ED Triage Notes (Signed)
 Patient stubbed her right pinky toe on the bed rail this morning. Toe is red and swollen and tender to touch.

## 2024-06-14 NOTE — Discharge Instructions (Signed)
 You were seen today with pain in your right lesser toe.  Your x-rays do not show any evidence of fracture.  This is likely a contusion.  Keep iced and elevated.  Take ibuprofen  for pain.

## 2024-06-14 NOTE — ED Provider Notes (Signed)
 [place] EMERGENCY DEPARTMENT AT [facility] Provider Note   CSN: [id number] Arrival date & time: 06/14/24  2237     Patient presents with: Toe Pain   Jane Collins is a 48 y.o. female.   HPI     This a 48 year old female who presents with right pinky toe pain.  She reports that she hit the edge of her bed earlier this morning.  Since that time she has had progressive right fifth toe pain.  Denies other injury.  Prior to Admission medications   Medication Sig Start Date End Date Taking? Authorizing Provider  acetaminophen  (TYLENOL ) 325 MG tablet Take 650 mg by mouth every 6 (six) hours as needed. Pain/headache     [provider]  citalopram (CELEXA) 20 MG tablet Take 20 mg by mouth daily. 12/23/23   [provider]  gabapentin (NEURONTIN) 100 MG capsule Take 200 mg by mouth. 01/13/24 01/27/24  [provider]  ibuprofen  (ADVIL ) 600 MG tablet Take 1 tablet (600 mg total) by mouth every 6 (six) hours as needed. 04/23/23   Bernis Ernst, PA-C  norethindrone  (ORTHO MICRONOR ) 0.35 MG tablet Take 1 tablet (0.35 mg total) by mouth daily. 01/13/24   Hines, Vera GAILS, MD  VENTOLIN HFA 108 (90 Base) MCG/ACT inhaler Inhale 2 puffs into the lungs. 01/13/24 02/12/24  [provider]    Allergies: Patient has no known allergies.    Review of Systems  Musculoskeletal:        Toe pain  All other systems reviewed and are negative.   Updated Vital Signs BP 135/75   Pulse 72   Temp 98 F (36.7 C) (Oral)   Resp 15   Ht 1.626 m (5' 4)   Wt 70.3 kg   LMP 05/14/2024 (Approximate)   SpO2 98%   BMI 26.61 kg/m   Physical Exam Vitals and nursing note reviewed.  Constitutional:      Appearance: She is well-developed. She is not ill-appearing.  HENT:     Head: Normocephalic and atraumatic.  Eyes:     Pupils: Pupils are equal, round, and reactive to light.  Cardiovascular:     Rate and Rhythm: Normal rate and regular rhythm.  Pulmonary:      Effort: Pulmonary effort is normal. No respiratory distress.  Abdominal:     Palpations: Abdomen is soft.  Musculoskeletal:     Cervical back: Neck supple.     Comments: Focused examination of the right foot with bruising noted over the medial aspect of the right fifth digit, there is a small blood blister at the distal end of the digit that does not extend into the subungual region, nail appears intact, no lacerations, 2+ DP pulse  Skin:    General: Skin is warm and dry.  Neurological:     Mental Status: She is alert and oriented to person, place, and time.  Psychiatric:        Mood and Affect: Mood normal.     (all labs ordered are listed, but only abnormal results are displayed) Labs Reviewed - No data to display  EKG: None  Radiology: DG Foot Complete Right Result Date: 06/14/2024 CLINICAL DATA:  Pain after injury.  Stubbed pinky toe. EXAM: RIGHT FOOT COMPLETE - 3+ VIEW COMPARISON:  None Available. FINDINGS: There is no evidence of fracture or dislocation. Particularly, no evidence of fracture of the fifth toe. There is no evidence of arthropathy or other focal bone abnormality. Soft tissues are unremarkable. IMPRESSION: Negative radiographs of the  right foot. Electronically Signed   By: Andrea Gasman M.D.   On: 06/14/2024 23:14     Procedures   Medications Ordered in the ED  ibuprofen  (ADVIL ) tablet 800 mg (has no administration in time range)                                    Medical Decision Making Amount and/or Complexity of Data Reviewed Radiology: ordered.  Risk Prescription drug management.   This patient presents to the ED for concern of toe injury, this involves an extensive number of treatment options, and is a complaint that carries with it a high risk of complications and morbidity.  I considered the following differential and admission for this acute, potentially life threatening condition.  The differential diagnosis includes fracture, contusion, nail  injury  MDM:    This is a 48 year old female who presents with injury to the right fifth toe.  She is nontoxic and vital signs are reassuring.  She has bruising to that toe and a small blood blister distal to the nail bed.  X-rays do not show any evidence of fracture.  Suspect contusion.  Recommend ice and elevation.  Ibuprofen  for pain.  (Labs, imaging, consults)  Labs: I Ordered, and personally interpreted labs.  The pertinent results include: N/A  Imaging Studies ordered: I ordered imaging studies including N/A I independently visualized and interpreted imaging. I agree with the radiologist interpretation  Additional history obtained from chart review.  External records from outside source obtained and reviewed including prior evaluations  Cardiac Monitoring: The patient was not maintained on a cardiac monitor.  If on the cardiac monitor, I personally viewed and interpreted the cardiac monitored which showed an underlying rhythm of: N/A  Reevaluation: After the interventions noted above, I reevaluated the patient and found that they have :stayed the same  Social Determinants of Health:  lives independently  Disposition: Discharge  Co morbidities that complicate the patient evaluation  Past Medical History:  Diagnosis Date   Diabetes mellitus    gestational diabetes-on novolin nph 17 units am and 26 units in pm, fasting generally runs 80-90   Headache(784.0)    History of MRSA infection    history of mrsa wound infection, post op sepsis   History of MRSA infection 2005   post op c/s infection mrsa-[place]     Medicines Meds ordered this encounter  Medications   ibuprofen  (ADVIL ) tablet 800 mg    I have reviewed the patients home medicines and have made adjustments as needed  Problem List / ED Course: Problem List Items Addressed This Visit   None Visit Diagnoses       Contusion of lesser toe of right foot without damage to nail, initial encounter    -  Primary                 Final diagnoses:  Contusion of lesser toe of right foot without damage to nail, initial encounter    ED Discharge Orders     None          Bari Charmaine FALCON, MD 06/14/24 2323

## 2024-11-01 ENCOUNTER — Ambulatory Visit: Admitting: Obstetrics and Gynecology
